# Patient Record
Sex: Female | Born: 1970 | Race: White | Hispanic: No | Marital: Married | Smoking: Never smoker
Health system: Southern US, Community
[De-identification: ages and names within clinical notes are randomized; demographics above are authoritative.]

## PROBLEM LIST (undated history)

## (undated) HISTORY — PX: TUBAL LIGATION: SHX77

---

## 2020-08-02 LAB — HM PAP SMEAR: HM Pap smear: NORMAL

## 2020-08-02 LAB — HM MAMMOGRAPHY: HM Mammogram: NORMAL (ref 0–4)

## 2020-08-02 LAB — HM COLONOSCOPY

## 2021-08-11 ENCOUNTER — Encounter: Payer: Self-pay | Admitting: Medical-Surgical

## 2021-08-11 ENCOUNTER — Other Ambulatory Visit: Payer: Self-pay

## 2021-08-11 ENCOUNTER — Ambulatory Visit: Payer: 59 | Admitting: Medical-Surgical

## 2021-08-11 VITALS — BP 153/83 | HR 62 | Resp 20 | Ht 67.0 in | Wt 198.0 lb

## 2021-08-11 DIAGNOSIS — R0609 Other forms of dyspnea: Secondary | ICD-10-CM

## 2021-08-11 DIAGNOSIS — E611 Iron deficiency: Secondary | ICD-10-CM

## 2021-08-11 DIAGNOSIS — A6000 Herpesviral infection of urogenital system, unspecified: Secondary | ICD-10-CM

## 2021-08-11 DIAGNOSIS — Z7689 Persons encountering health services in other specified circumstances: Secondary | ICD-10-CM | POA: Diagnosis not present

## 2021-08-11 DIAGNOSIS — R079 Chest pain, unspecified: Secondary | ICD-10-CM

## 2021-08-11 DIAGNOSIS — I10 Essential (primary) hypertension: Secondary | ICD-10-CM | POA: Diagnosis not present

## 2021-08-11 LAB — COMPLETE METABOLIC PANEL WITH GFR
AG Ratio: 1.6 (calc) (ref 1.0–2.5)
ALT: 15 U/L (ref 6–29)
AST: 16 U/L (ref 10–35)
Albumin: 4.7 g/dL (ref 3.6–5.1)
Alkaline phosphatase (APISO): 71 U/L (ref 31–125)
BUN: 15 mg/dL (ref 7–25)
CO2: 25 mmol/L (ref 20–32)
Calcium: 9.5 mg/dL (ref 8.6–10.2)
Chloride: 103 mmol/L (ref 98–110)
Creat: 0.74 mg/dL (ref 0.50–0.99)
Globulin: 3 g/dL (calc) (ref 1.9–3.7)
Glucose, Bld: 90 mg/dL (ref 65–99)
Potassium: 3.9 mmol/L (ref 3.5–5.3)
Sodium: 137 mmol/L (ref 135–146)
Total Bilirubin: 0.5 mg/dL (ref 0.2–1.2)
Total Protein: 7.7 g/dL (ref 6.1–8.1)
eGFR: 99 mL/min/{1.73_m2} (ref >=60)

## 2021-08-11 LAB — CBC WITH DIFFERENTIAL/PLATELET
Absolute Monocytes: 450 cells/uL (ref 200–950)
Basophils Absolute: 32 cells/uL (ref 0–200)
Basophils Relative: 0.4 %
Eosinophils Absolute: 63 cells/uL (ref 15–500)
Eosinophils Relative: 0.8 %
HCT: 36.3 % (ref 35.0–45.0)
Hemoglobin: 10.4 g/dL — ABNORMAL LOW (ref 11.7–15.5)
Lymphs Abs: 1651 cells/uL (ref 850–3900)
MCH: 21.1 pg — ABNORMAL LOW (ref 27.0–33.0)
MCHC: 28.7 g/dL — ABNORMAL LOW (ref 32.0–36.0)
MCV: 73.5 fL — ABNORMAL LOW (ref 80.0–100.0)
MPV: 10.5 fL (ref 7.5–12.5)
Monocytes Relative: 5.7 %
Neutro Abs: 5704 cells/uL (ref 1500–7800)
Neutrophils Relative %: 72.2 %
Platelets: 336 10*3/uL (ref 140–400)
RBC: 4.94 10*6/uL (ref 3.80–5.10)
RDW: 15.4 % — ABNORMAL HIGH (ref 11.0–15.0)
Total Lymphocyte: 20.9 %
WBC: 7.9 10*3/uL (ref 3.8–10.8)

## 2021-08-11 LAB — LIPID PANEL
Cholesterol: 199 mg/dL (ref <200)
HDL: 66 mg/dL (ref >=50)
LDL Cholesterol (Calc): 113 mg/dL (calc) — ABNORMAL HIGH
Non-HDL Cholesterol (Calc): 133 mg/dL (calc) — ABNORMAL HIGH (ref <130)
Total CHOL/HDL Ratio: 3 (calc) (ref <5.0)
Triglycerides: 96 mg/dL (ref <150)

## 2021-08-11 LAB — TSH: TSH: 1.17 mIU/L

## 2021-08-11 MED ORDER — AMLODIPINE BESYLATE 5 MG PO TABS: 5.0000 mg | ORAL_TABLET | Freq: Every day | ORAL | 0 refills | Status: AC

## 2021-08-11 NOTE — Patient Instructions (Signed)

## 2021-08-11 NOTE — Progress Notes (Signed)
New Patient Office Visit  Subjective with pertinent ROS:  Patient ID: Kim Gillespie, female    DOB: 27-Oct-1970  Age: 51 y.o. MRN: [id number]  CC:  Chief Complaint  Patient presents with   Establish Care     HPI Kim Gillespie presents to establish care. She is a pleasant 51 year old female who has recently relocated to the area with her husband and children. This has been a bit stressful for her and she is trying to manage feelings of depression. Not currently taking medications. Not doing counseling. Is a person of strong faith and has trouble reconciling the need for a counselor with the teachings of her faith.   Has a history of elevated blood pressures and was previously treated with HCTZ (unknown dose) which was reportedly ineffective. Not currently treated with medication. Admits to adding salt to food. Has monitored her BP at home in the past but does not do so anymore because it was always elevated and her prior PCP told her to just stop checking it. Has been having chest pains/pressure along with shortness of breath with increased activity but also some episodes while at rest. Pain occasionally occurs after eating. Has noted some radiation into the shoulders/neck/jaw at times. This has been going on for more than a few months. Does not recall having an EKG, echocardiogram, etc. Last labs checked around May 2022.   Brief exam, Assessment, and Plan:  Today's Vitals: BP (!) 153/83 (BP Location: Left Arm, Patient Position: Sitting, Cuff Size: Normal)    Pulse 62    Resp 20    Wt 198 lb (89.8 kg)    LMP  (LMP Unknown)    SpO2 98% .  1. Encounter to establish care Reviewed available information and discussed care concerns with patient.   2. Essential hypertension Checking labs as below.  On exam, regular heart rate and rhythm, EKG done in office showing rate of 64, normal sinus rhythm with sinus arrhythmia, normal axis, questionable septal infarct but no acute changes today.  No significant  lower extremity edema or dyspnea at rest.  No current chest pain.  With her history of consistently high blood pressures, recommend low-sodium diet, regular intense exercise, as well as starting medication to help control blood pressure.  Discussed this with patient and she is amenable to trialing medication.  She did not do well with a hydrochlorothiazide dose in the past so we will start amlodipine 5 mg daily. - CBC with Differential - COMPLETE METABOLIC PANEL WITH GFR - Lipid panel - TSH  3. Chest pain, unspecified type 4. Dyspnea on exertion This appears to be a long-term issue and with no records on hand, I am unsure what has been done in the past.  EKG in office today as above.  Checking labs.  Will likely order an echocardiogram for further evaluation.  Ultimately, suspect we will need to get her in with cardiology for further work-up and management. - EKG 12-Lead - CBC with Differential - COMPLETE METABOLIC PANEL WITH GFR - Lipid panel - TSH  5. Genital herpes simplex, unspecified site Continue acyclovir as needed for outbreaks.   Outpatient Encounter Medications as of 08/11/2021  Medication Sig   Acyclovir (ZOVIRAX PO) Take by mouth as needed.   amLODipine (NORVASC) 5 MG tablet Take 1 tablet (5 mg total) by mouth daily.   No facility-administered encounter medications on file as of 08/11/2021.    Follow-up: Return in about 2 weeks (around 08/25/2021) for nurse visit for BP check.  Thayer Ohm, DNP, APRN, FNP-BC [facility] and Sports Medicine

## 2021-08-12 ENCOUNTER — Other Ambulatory Visit: Payer: Self-pay | Admitting: Medical-Surgical

## 2021-08-12 DIAGNOSIS — R079 Chest pain, unspecified: Secondary | ICD-10-CM

## 2021-08-12 DIAGNOSIS — R0609 Other forms of dyspnea: Secondary | ICD-10-CM

## 2021-08-12 DIAGNOSIS — I10 Essential (primary) hypertension: Secondary | ICD-10-CM

## 2021-08-12 NOTE — Addendum Note (Signed)
Addended by: Stan Head on: 08/12/2021 02:03 PM   Modules accepted: Orders

## 2021-08-14 ENCOUNTER — Telehealth: Payer: Self-pay | Admitting: *Deleted

## 2021-08-14 NOTE — Telephone Encounter (Signed)
Per referral Dr. Referral called and gave upcoming appointments - confirmed

## 2021-08-19 ENCOUNTER — Encounter: Payer: Self-pay | Admitting: Obstetrics & Gynecology

## 2021-08-27 DIAGNOSIS — R072 Precordial pain: Secondary | ICD-10-CM | POA: Insufficient documentation

## 2021-08-27 NOTE — Progress Notes (Signed)
Cardiology Office Note   Date:  08/28/2021   ID:  Kim Gillespie, DOB 1970/10/10, MRN [id number]  PCP:  Kim Butter, NP  Cardiologist:   None Referring:  Kim Butter, NP  Chief Complaint  Patient presents with   Chest Pain      History of Present Illness: Kim Gillespie is a 51 y.o. female who presents for evaluation of chest pain.  She was referred by Kim Gillespie, NPPatient has no past cardiac history.  She says about a year and a half she has been getting intermittent mid upper left chest discomfort.  It is sharp.  It comes and goes.  It happens when she is not doing anything.  It might happen after eating.  It might be more reproducible with walking but not all the time.  She thinks it does happen most of the time when she is walking and she sometimes "powers through it."  He does not go away until she really stops what she is doing.  She will notice it goes away after about 5 minutes.  It might be 6 out of 10 in intensity at that most.  It seems to be somewhat stable intermittent pattern.  She has not had any prior cardiac testing.  She does not describe radiation to her arms.  She has had some jaw popping and discomfort in the past not related to the chest discomfort.  She has not had any associated shortness of breath and does not describe PND or orthopnea.  She is not having any new palpitations, presyncope or syncope.     History reviewed. No pertinent past medical history.  Past Surgical History:  Procedure Laterality Date   TUBAL LIGATION       Current Outpatient Medications  Medication Sig Dispense Refill   Acyclovir (ZOVIRAX PO) Take by mouth as needed.     amLODipine (NORVASC) 5 MG tablet Take 1 tablet (5 mg total) by mouth daily. (Patient not taking: Reported on 08/28/2021) 90 tablet 0   No current facility-administered medications for this visit.    Allergies:   Iodine    Social History:  The patient  reports that she has never smoked. She has never used  smokeless tobacco. She reports that she does not drink alcohol and does not use drugs.   Family History:  The patient's family history includes Diabetes in her father; Hypertension in her brother, father, and mother.    ROS:  Please see the history of present illness.   Otherwise, review of systems are positive for none.   All other systems are reviewed and negative.    PHYSICAL EXAM: VS:  BP (!) 142/98    Pulse 78    Ht 5' 5.5" (1.664 m)    Wt 202 lb 9.6 oz (91.9 kg)    LMP  (LMP Unknown)    SpO2 100%    BMI 33.20 kg/m  , BMI Body mass index is 33.2 kg/m. GENERAL:  Well appearing HEENT:  Pupils equal round and reactive, fundi not visualized, oral mucosa unremarkable NECK:  No jugular venous distention, waveform within normal limits, carotid upstroke brisk and symmetric, no bruits, no thyromegaly LYMPHATICS:  No cervical, inguinal adenopathy LUNGS:  Clear to auscultation bilaterally BACK:  No CVA tenderness CHEST:  Unremarkable HEART:  PMI not displaced or sustained,S1 and S2 within normal limits, no S3, no S4, no clicks, no rubs, no murmurs ABD:  Flat, positive bowel sounds normal in frequency in pitch, no bruits, no  rebound, no guarding, no midline pulsatile mass, no hepatomegaly, no splenomegaly EXT:  2 plus pulses throughout, no edema, no cyanosis no clubbing SKIN:  No rashes no nodules NEURO:  Cranial nerves II through XII grossly intact, motor grossly intact throughout PSYCH:  Cognitively intact, oriented to person place and time    EKG:  EKG is not ordered today. The ekg ordered 08/11/2021 demonstrates normal sinus rhythm, rate 64, axis within normal limits, intervals within normal limits, no acute ST-T wave changes.   Recent Labs: 08/11/2021: ALT 15; BUN 15; Creat 0.74; Hemoglobin 10.4; Platelets 336; Potassium 3.9; Sodium 137; TSH 1.17    Lipid Panel    Component Value Date/Time   CHOL 199 08/11/2021 0000   TRIG 96 08/11/2021 0000   HDL 66 08/11/2021 0000   CHOLHDL  3.0 08/11/2021 0000   LDLCALC 113 (H) 08/11/2021 0000      Wt Readings from Last 3 Encounters:  08/28/21 202 lb 9.6 oz (91.9 kg)  08/11/21 198 lb (89.8 kg)      Other studies Reviewed: Additional studies/ records that were reviewed today include: Labs. Review of the above records demonstrates:  Please see elsewhere in the note.     ASSESSMENT AND PLAN:  Precordial chest pain.  Her chest pain has nonanginal greater than anginal features.  I think the pretest probability of obstructive coronary disease is low.  She does have some risk factors with mild dyslipidemia and new hypertension diagnosis.  I will bring her back for a POET (Plain Old Exercise Treadmill) and a coronary calcium score.  HTN: She was just diagnosed with hypertension.  She has not yet started her amlodipine that was prescribed by her primary provider and I would agree with this choice.   Current medicines are reviewed at length with the patient today.  The patient does not have concerns regarding medicines.  The following changes have been made:  no change  Labs/ tests ordered today include:   Orders Placed This Encounter  Procedures   CT CARDIAC SCORING (SELF PAY ONLY)   Exercise Tolerance Test     Disposition:   FU with me as needed.      Signed, Rollene Rotunda, MD  08/28/2021 4:52 PM    [facility]

## 2021-08-28 ENCOUNTER — Ambulatory Visit: Payer: 59

## 2021-08-28 ENCOUNTER — Other Ambulatory Visit: Payer: Self-pay

## 2021-08-28 ENCOUNTER — Ambulatory Visit: Payer: 59 | Admitting: Cardiology

## 2021-08-28 ENCOUNTER — Encounter: Payer: Self-pay | Admitting: Cardiology

## 2021-08-28 VITALS — BP 142/98 | HR 78 | Ht 65.5 in | Wt 202.6 lb

## 2021-08-28 DIAGNOSIS — R072 Precordial pain: Secondary | ICD-10-CM | POA: Diagnosis not present

## 2021-08-28 NOTE — Patient Instructions (Addendum)
Medication Instructions:  Your physician recommends that you continue on your current medications as directed. Please refer to the Current Medication list given to you today.   Labwork: NONE  Testing/Procedures: CALCIUM SCORE - THIS WILL COST YOU $99 OUT OF POCKET  Your physician has requested that you have an exercise tolerance test. For further information please visit [email or web address] Please also follow instruction sheet, as given.  Follow-Up: AS NEEDED   OMRON IS THE BLOOD PRESSURE MACHINE RECOMMENDED NOT THE ONE THAT GOES AROUND THE WRIST

## 2021-09-02 ENCOUNTER — Other Ambulatory Visit: Payer: 59

## 2021-09-02 ENCOUNTER — Encounter: Payer: 59 | Admitting: Hematology & Oncology

## 2021-09-08 ENCOUNTER — Ambulatory Visit (HOSPITAL_COMMUNITY): Admission: RE | Admit: 2021-09-08 | Payer: 59 | Source: Ambulatory Visit | Attending: Cardiology | Admitting: Cardiology

## 2021-09-08 NOTE — Addendum Note (Signed)
Addended by: Tobin Chad on: 09/08/2021 09:20 AM   Modules accepted: Orders

## 2021-09-08 NOTE — Addendum Note (Signed)
Addended by: Rollene Rotunda on: 09/08/2021 05:46 PM   Modules accepted: Orders

## 2021-09-09 ENCOUNTER — Inpatient Hospital Stay: Payer: 59 | Admitting: Hematology & Oncology

## 2021-09-09 ENCOUNTER — Encounter: Payer: Self-pay | Admitting: Hematology & Oncology

## 2021-09-09 ENCOUNTER — Other Ambulatory Visit: Payer: Self-pay

## 2021-09-09 ENCOUNTER — Inpatient Hospital Stay: Payer: 59 | Attending: Hematology & Oncology

## 2021-09-09 VITALS — BP 129/87 | HR 71 | Temp 98.8°F | Resp 18 | Ht 65.5 in | Wt 202.8 lb

## 2021-09-09 DIAGNOSIS — N921 Excessive and frequent menstruation with irregular cycle: Secondary | ICD-10-CM | POA: Insufficient documentation

## 2021-09-09 DIAGNOSIS — D509 Iron deficiency anemia, unspecified: Secondary | ICD-10-CM

## 2021-09-09 DIAGNOSIS — Z7189 Other specified counseling: Secondary | ICD-10-CM | POA: Insufficient documentation

## 2021-09-09 DIAGNOSIS — D500 Iron deficiency anemia secondary to blood loss (chronic): Secondary | ICD-10-CM

## 2021-09-09 HISTORY — DX: Other specified counseling: Z71.89

## 2021-09-09 HISTORY — DX: Iron deficiency anemia, unspecified: D50.9

## 2021-09-09 HISTORY — DX: Excessive and frequent menstruation with irregular cycle: N92.1

## 2021-09-09 LAB — CMP (CANCER CENTER ONLY)
ALT: 15 U/L (ref 0–44)
AST: 14 U/L — ABNORMAL LOW (ref 15–41)
Albumin: 4 g/dL (ref 3.5–5.0)
Alkaline Phosphatase: 66 U/L (ref 38–126)
Anion gap: 6 (ref 5–15)
BUN: 12 mg/dL (ref 6–20)
CO2: 28 mmol/L (ref 22–32)
Calcium: 9.3 mg/dL (ref 8.9–10.3)
Chloride: 103 mmol/L (ref 98–111)
Creatinine: 0.73 mg/dL (ref 0.44–1.00)
GFR, Estimated: >60 mL/min (ref >60)
Glucose, Bld: 88 mg/dL (ref 70–99)
Potassium: 3.9 mmol/L (ref 3.5–5.1)
Sodium: 137 mmol/L (ref 135–145)
Total Bilirubin: 0.3 mg/dL (ref 0.3–1.2)
Total Protein: 6.8 g/dL (ref 6.5–8.1)

## 2021-09-09 LAB — CBC WITH DIFFERENTIAL (CANCER CENTER ONLY)
Abs Immature Granulocytes: 0.02 10*3/uL (ref 0.00–0.07)
Basophils Absolute: 0.1 10*3/uL (ref 0.0–0.1)
Basophils Relative: 1 %
Eosinophils Absolute: 0.1 10*3/uL (ref 0.0–0.5)
Eosinophils Relative: 1 %
HCT: 31.6 % — ABNORMAL LOW (ref 36.0–46.0)
Hemoglobin: 9.2 g/dL — ABNORMAL LOW (ref 12.0–15.0)
Immature Granulocytes: 0 %
Lymphocytes Relative: 25 %
Lymphs Abs: 1.8 10*3/uL (ref 0.7–4.0)
MCH: 21.1 pg — ABNORMAL LOW (ref 26.0–34.0)
MCHC: 29.1 g/dL — ABNORMAL LOW (ref 30.0–36.0)
MCV: 72.5 fL — ABNORMAL LOW (ref 80.0–100.0)
Monocytes Absolute: 0.5 10*3/uL (ref 0.1–1.0)
Monocytes Relative: 8 %
Neutro Abs: 4.7 10*3/uL (ref 1.7–7.7)
Neutrophils Relative %: 65 %
Platelet Count: 275 10*3/uL (ref 150–400)
RBC: 4.36 MIL/uL (ref 3.87–5.11)
RDW: 17.3 % — ABNORMAL HIGH (ref 11.5–15.5)
WBC Count: 7.1 10*3/uL (ref 4.0–10.5)
nRBC: 0 % (ref 0.0–0.2)

## 2021-09-09 LAB — RETICULOCYTES
Immature Retic Fract: 18.9 % — ABNORMAL HIGH (ref 2.3–15.9)
RBC.: 4.35 MIL/uL (ref 3.87–5.11)
Retic Count, Absolute: 52.2 10*3/uL (ref 19.0–186.0)
Retic Ct Pct: 1.2 % (ref 0.4–3.1)

## 2021-09-09 LAB — SAVE SMEAR(SSMR), FOR PROVIDER SLIDE REVIEW

## 2021-09-09 NOTE — Progress Notes (Signed)
Referral MD  Reason for Referral: Microcytic anemia-likely iron deficiency secondary to menometrorrhagia  Chief Complaint  Patient presents with   New Patient (Initial Visit)  : I feel tired and more blood is low.  HPI: Kim Gillespie is a very nice 51 year old Afro-American female.  She  I moved here from [place] back in October.  They were in [place] for several years.  They lived in the [place].  She has been feeling tired.  She does have heavy cycles.  She had lab work done about a month ago.  This showed a hemoglobin of 10.4 hematocrit of 36.3.  The MCV was only 73.5.  She has not had any other issues with bleeding outside of the monthly cycles.  She is not a vegetarian.  She does chew a lot of iron.  She has had past surgery only with tube tine.  There is no one in the family that has malignancy.  As far as he knows, there is no history of sickle cell.  She does not drink.  She does not smoke.  She just feels tired.  She does not have a lot of stamina.  Currently, I would say performance status is probably ECOG 1.   No past medical history on file.:   Past Surgical History:  Procedure Laterality Date   TUBAL LIGATION    :   Current Outpatient Medications:    Acyclovir (ZOVIRAX PO), Take by mouth as needed., Disp: , Rfl:    amLODipine (NORVASC) 5 MG tablet, Take 1 tablet (5 mg total) by mouth daily., Disp: 90 tablet, Rfl: 0:  :   Allergies  Allergen Reactions   Iodine Shortness Of Breath  :   Family History  Problem Relation Age of Onset   Hypertension Mother    Diabetes Father    Hypertension Father    Hypertension Brother   :   Social History   Socioeconomic History   Marital status: Married    Spouse name: Not on file   Number of children: Not on file   Years of education: Not on file   Highest education level: Not on file  Occupational History   Not on file  Tobacco Use   Smoking status: Never   Smokeless tobacco: Never   Vaping Use   Vaping Use: Never used  Substance and Sexual Activity   Alcohol use: Never   Drug use: Never   Sexual activity: Yes    Birth control/protection: Surgical  Other Topics Concern   Not on file  Social History Narrative   Lives at home with husband and children.  Three children.       Social Determinants of Health   Financial Resource Strain: Not on file  Food Insecurity: Not on file  Transportation Needs: Not on file  Physical Activity: Not on file  Stress: Not on file  Social Connections: Not on file  Intimate Partner Violence: Not on file  :  Review of Systems  Constitutional:  Positive for malaise/fatigue.  HENT: Negative.    Eyes: Negative.   Respiratory: Negative.    Cardiovascular: Negative.   Gastrointestinal: Negative.   Genitourinary:  Positive for frequency.  Musculoskeletal: Negative.   Skin: Negative.   Neurological: Negative.   Endo/Heme/Allergies: Negative.   Psychiatric/Behavioral: Negative.      Exam: @IPVITALS @ Physical Exam Vitals reviewed.  HENT:     Head: Normocephalic and atraumatic.  Eyes:     Pupils: Pupils are equal, round, and reactive to light.  Cardiovascular:     Rate and Rhythm: Normal rate and regular rhythm.     Heart sounds: Normal heart sounds.  Pulmonary:     Effort: Pulmonary effort is normal.     Breath sounds: Normal breath sounds.  Abdominal:     General: Bowel sounds are normal.     Palpations: Abdomen is soft.  Musculoskeletal:        General: No tenderness or deformity. Normal range of motion.     Cervical back: Normal range of motion.  Lymphadenopathy:     Cervical: No cervical adenopathy.  Skin:    General: Skin is warm and dry.     Findings: No erythema or rash.  Neurological:     Mental Status: She is alert and oriented to person, place, and time.  Psychiatric:        Behavior: Behavior normal.        Thought Content: Thought content normal.        Judgment: Judgment normal.      Recent  Labs    09/09/21 1340  WBC 7.1  HGB 9.2*  HCT 31.6*  PLT 275    Recent Labs    09/09/21 1340  NA 137  K 3.9  CL 103  CO2 28  GLUCOSE 88  BUN 12  CREATININE 0.73  CALCIUM 9.3    Blood smear review: She has mild anisocytosis and poikilocytosis.  There is some polychromasia.  I do not see any nucleated red blood cells.  I do not see any target cells.  She has no schistocytes.  There is no rouleaux formation.  White blood cells appear normal in morphology maturation.  There are no hypersegmented polys.  There is no immature myeloid or lymphoid cells.  Platelets are adequate in number and size.  Platelets are well granulated.  Pathology: None    Assessment and Plan: Kim Gillespie is a very nice 51 year old Afro-American female.  She has microcytic anemia.  I had to believe this could be iron deficiency by the blood smear and by her history.  She does have the menometrorrhagia.  We will see what her iron studies show.  I did talk to her about the options that we have.  We can certainly try oral iron.  She is young.  She is in great condition.  She certainly would be a candidate for oral iron.  I told her that 1 issue with oral iron is at there are possibilities of abdominal spasms.  I also talked her about IV iron.  This also would be a option for her.  We will see what her iron studies show.  We will then give her a call and then we will decide what might be best for her.  We typically see patients back about 6 weeks after IV iron or oral iron is given.  Hopefully, with Kim Gillespie, when we see her back, her MCV will be better and her hemoglobin will definitely improve.  As she will have a better quality of life with more stamina.

## 2021-09-10 ENCOUNTER — Encounter: Payer: Self-pay | Admitting: *Deleted

## 2021-09-10 LAB — FERRITIN: Ferritin: <4 ng/mL — ABNORMAL LOW (ref 11–307)

## 2021-09-10 LAB — ERYTHROPOIETIN: Erythropoietin: 113 m[IU]/mL — ABNORMAL HIGH (ref 2.6–18.5)

## 2021-09-11 ENCOUNTER — Telehealth: Payer: Self-pay | Admitting: Hematology & Oncology

## 2021-09-11 ENCOUNTER — Encounter: Payer: Self-pay | Admitting: Medical-Surgical

## 2021-09-11 LAB — HGB FRACTIONATION CASCADE
Hgb A2: 1.9 % (ref 1.8–3.2)
Hgb A: 98.1 % (ref 96.4–98.8)
Hgb F: 0 % (ref 0.0–2.0)
Hgb S: 0 %

## 2021-09-11 NOTE — Telephone Encounter (Signed)
Called to schedule 3 doses of iron (Venofer 300 mg) per 2/10 sch msg , no answer and unable to leave voicemail

## 2021-09-16 ENCOUNTER — Other Ambulatory Visit: Payer: 59

## 2021-09-17 ENCOUNTER — Other Ambulatory Visit: Payer: Self-pay | Admitting: Hematology & Oncology

## 2021-09-17 ENCOUNTER — Other Ambulatory Visit: Payer: Self-pay

## 2021-09-17 ENCOUNTER — Inpatient Hospital Stay: Payer: 59

## 2021-09-17 VITALS — BP 145/78 | HR 75 | Temp 98.1°F | Resp 17

## 2021-09-17 DIAGNOSIS — D508 Other iron deficiency anemias: Secondary | ICD-10-CM

## 2021-09-17 DIAGNOSIS — D509 Iron deficiency anemia, unspecified: Secondary | ICD-10-CM | POA: Diagnosis not present

## 2021-09-17 MED ORDER — SODIUM CHLORIDE 0.9 % IV SOLN: Freq: Once | INTRAVENOUS | Status: AC

## 2021-09-17 MED ORDER — SODIUM CHLORIDE 0.9 % IV SOLN
300.0000 mg | Freq: Once | INTRAVENOUS | Status: AC
  Administered 2021-09-17: 300 mg via INTRAVENOUS
  Filled 2021-09-17: qty 300

## 2021-09-17 NOTE — Patient Instructions (Signed)

## 2021-09-24 ENCOUNTER — Encounter: Payer: Self-pay | Admitting: Medical-Surgical

## 2021-09-24 ENCOUNTER — Inpatient Hospital Stay: Payer: 59

## 2021-09-24 ENCOUNTER — Telehealth: Payer: 59 | Admitting: Medical-Surgical

## 2021-09-24 ENCOUNTER — Other Ambulatory Visit: Payer: Self-pay

## 2021-09-24 ENCOUNTER — Telehealth (HOSPITAL_COMMUNITY): Payer: Self-pay | Admitting: *Deleted

## 2021-09-24 VITALS — BP 150/92 | HR 74 | Temp 98.9°F | Resp 18

## 2021-09-24 DIAGNOSIS — F418 Other specified anxiety disorders: Secondary | ICD-10-CM | POA: Diagnosis not present

## 2021-09-24 DIAGNOSIS — D509 Iron deficiency anemia, unspecified: Secondary | ICD-10-CM | POA: Diagnosis not present

## 2021-09-24 DIAGNOSIS — D508 Other iron deficiency anemias: Secondary | ICD-10-CM

## 2021-09-24 MED ORDER — SODIUM CHLORIDE 0.9 % IV SOLN: Freq: Once | INTRAVENOUS | Status: AC

## 2021-09-24 MED ORDER — SODIUM CHLORIDE 0.9 % IV SOLN
300.0000 mg | Freq: Once | INTRAVENOUS | Status: AC
  Administered 2021-09-24: 300 mg via INTRAVENOUS
  Filled 2021-09-24: qty 300

## 2021-09-24 NOTE — Patient Instructions (Signed)

## 2021-09-24 NOTE — Telephone Encounter (Signed)
Close encounter 

## 2021-09-24 NOTE — Progress Notes (Signed)
Virtual Visit via Telephone   I connected with  Kim Gillespie  on 09/24/21 by telephone/telehealth and verified that I am speaking with the correct person using two identifiers.   I discussed the limitations, risks, security and privacy concerns of performing an evaluation and management service by telephone, including the higher likelihood of inaccurate diagnosis and treatment, and the availability of in person appointments.  We also discussed the likely need of an additional face to face encounter for complete and high quality delivery of care.  I also discussed with the patient that there may be a patient responsible charge related to this service. The patient expressed understanding and wishes to proceed.  Provider location is in medical facility. Patient location is at their home, different from provider location. People involved in care of the patient during this telehealth encounter were myself, my nurse/medical assistant, and my front office/scheduling team member.  CC: Acute anxiety  HPI: Pleasant 51 year old female presenting via telephone to discuss this with acute anxiety, depression, and passive thoughts of self-harm with no active plan.  She has been struggling with this for a while.  Is not currently interested in medication however is looking for counseling and possibly time away from work to help settle her mental health issues.  She is working with the [employer] at her job but they have recommended counseling on her own or an outpatient counseling program.  She had originally discussed a possible referral to behavioral health through her PCP however this would be quite costly with her insurance.  If she goes to the EAP, the cost is negligible.  She has not tried over-the-counter/herbal options for anxiety management but is open to suggestions.  Denies HI.  Review of Systems: See HPI for pertinent positives and negatives.   Objective Findings:    General: Speaking full sentences, no  audible heavy breathing.  Sounds alert and appropriately interactive.    Impression and Recommendations:    1. Anxiety with depression Plan to proceed with counseling through the EAP as this is financially feasible.  Referring to psychiatry for further evaluation and recommendations.  Denies active plan for self-harm at this time.  Sending psychological emergency resources via MyChart message.  Also including information on herbal options and over-the-counter medications that may help with her levels of anxiety.  Discussed medication for mood management but she is not open to this at this time. - Ambulatory referral to Psychiatry  I discussed the above assessment and treatment plan with the patient. The patient was provided an opportunity to ask questions and all were answered. The patient agreed with the plan and demonstrated an understanding of the instructions.   The patient was advised to call back or seek an in-person evaluation if the symptoms worsen or if the condition fails to improve as anticipated.  15 minutes of non-face-to-face time was provided during this encounter.  Return if symptoms worsen or fail to improve. ___________________________________________ Christen Butter, DNP, APRN, FNP-BC Primary Care and Sports Medicine [facility] [place]

## 2021-09-25 ENCOUNTER — Ambulatory Visit (INDEPENDENT_AMBULATORY_CARE_PROVIDER_SITE_OTHER)
Admission: RE | Admit: 2021-09-25 | Discharge: 2021-09-25 | Disposition: A | Discharge status: Home / Self Care | Payer: Self-pay | Source: Ambulatory Visit | Attending: Cardiology | Admitting: Cardiology

## 2021-09-25 ENCOUNTER — Ambulatory Visit (HOSPITAL_COMMUNITY)
Admission: RE | Admit: 2021-09-25 | Discharge: 2021-09-25 | Disposition: A | Discharge status: Home / Self Care | Payer: 59 | Source: Ambulatory Visit | Attending: Cardiology | Admitting: Cardiology

## 2021-09-25 ENCOUNTER — Encounter: Payer: Self-pay | Admitting: Radiology

## 2021-09-25 DIAGNOSIS — R072 Precordial pain: Secondary | ICD-10-CM

## 2021-09-28 LAB — EXERCISE TOLERANCE TEST
Base ST Depression (mm): 0 mm
Estimated workload: 10.1
Exercise duration (min): 8 min
Exercise duration (sec): 17 s
MPHR: 171 {beats}/min
Peak HR: 169 {beats}/min
Percent HR: 98 %
Rest HR: 83 {beats}/min
ST Depression (mm): 0 mm

## 2021-10-01 ENCOUNTER — Encounter: Payer: Self-pay | Admitting: Hematology & Oncology

## 2021-10-01 ENCOUNTER — Inpatient Hospital Stay: Payer: 59 | Attending: Hematology & Oncology

## 2021-10-01 ENCOUNTER — Other Ambulatory Visit: Payer: Self-pay

## 2021-10-01 VITALS — BP 126/67 | HR 69 | Temp 98.5°F | Resp 17

## 2021-10-01 DIAGNOSIS — D500 Iron deficiency anemia secondary to blood loss (chronic): Secondary | ICD-10-CM | POA: Diagnosis not present

## 2021-10-01 DIAGNOSIS — N921 Excessive and frequent menstruation with irregular cycle: Secondary | ICD-10-CM | POA: Insufficient documentation

## 2021-10-01 DIAGNOSIS — Z79899 Other long term (current) drug therapy: Secondary | ICD-10-CM | POA: Insufficient documentation

## 2021-10-01 DIAGNOSIS — D508 Other iron deficiency anemias: Secondary | ICD-10-CM

## 2021-10-01 MED ORDER — SODIUM CHLORIDE 0.9 % IV SOLN
300.0000 mg | Freq: Once | INTRAVENOUS | Status: AC
  Administered 2021-10-01: 300 mg via INTRAVENOUS
  Filled 2021-10-01: qty 300

## 2021-10-01 MED ORDER — SODIUM CHLORIDE 0.9 % IV SOLN: Freq: Once | INTRAVENOUS | Status: AC

## 2021-10-01 NOTE — Progress Notes (Signed)
Pt declined to stay for post infusion observation period. Pt stated she has tolerated medication multiple times prior without difficulty. Pt aware to call clinic with any questions or concerns. Pt verbalized understanding and had no further questions.  ? ?

## 2021-10-01 NOTE — Patient Instructions (Signed)

## 2021-10-19 ENCOUNTER — Encounter: Payer: Self-pay | Admitting: Hematology & Oncology

## 2021-10-20 ENCOUNTER — Encounter: Payer: Self-pay | Admitting: Hematology & Oncology

## 2021-10-26 ENCOUNTER — Ambulatory Visit: Payer: Managed Care, Other (non HMO) | Admitting: Psychiatry

## 2021-10-28 ENCOUNTER — Other Ambulatory Visit: Payer: Self-pay

## 2021-10-28 ENCOUNTER — Other Ambulatory Visit: Payer: Self-pay | Admitting: *Deleted

## 2021-10-28 ENCOUNTER — Ambulatory Visit (INDEPENDENT_AMBULATORY_CARE_PROVIDER_SITE_OTHER): Payer: 59 | Admitting: Psychiatry

## 2021-10-28 DIAGNOSIS — F321 Major depressive disorder, single episode, moderate: Secondary | ICD-10-CM | POA: Diagnosis not present

## 2021-10-28 DIAGNOSIS — D509 Iron deficiency anemia, unspecified: Secondary | ICD-10-CM

## 2021-10-28 DIAGNOSIS — D500 Iron deficiency anemia secondary to blood loss (chronic): Secondary | ICD-10-CM

## 2021-10-28 DIAGNOSIS — D508 Other iron deficiency anemias: Secondary | ICD-10-CM

## 2021-10-28 NOTE — Progress Notes (Signed)
[facility] Counsel[facility] Initial Adult Exam ? ?Name: Kim Gillespie ?Date: 10/28/2021 ?MRN: [id number] ?DOB: 22-Dec-1970 ?PCP: Samuel Bouche, NP ? ?Time spent: 58 minutes ? ?Guardian/Payee:  patient   ? ?Paperwork requested:  No  ? ?Reason for Visit /Presenting Problem:  anxiety, depression, feeling sad, tired, stressed, some occasional SI triggered by stress "but none today" , previous family therapy a yr ago and some marital therapy about 2 yrs ago; work is very stressful. Doesn't want medication. Doesn't talk to anyone about "all this" except her husband "a little bit" ? ?Mental Status Exam: ?  ? ?Appearance:   Neat     ?Behavior:  Appropriate, Sharing, and Motivated  ?Motor:  Normal  ?Speech/Language:   Clear and Coherent  ?Affect:  Depressed and anxiety  ?Mood:  anxious and depressed  ?Thought process:  goal directed  ?Thought content:    Some overthinking  ?Sensory/Perceptual disturbances:    WNL  ?Orientation:  oriented to person, place, time/date, situation, day of week, month of year, year, and stated date of October 28, 2021  ?Attention:  Good  ?Concentration:  Fair  ?Memory:  Reports some short term memory issues and feels her stress is related to her memory issues  ?Fund of knowledge:   Good  ?Insight:    Good and Fair  ?Judgment:   Good  ?Impulse Control:  Good  ? ?Reported Symptoms:  see symptoms above ? ?Risk Assessment: ?Danger to Self:   not currently but does admit to occasional SI; commits to no self-harm today ?Self-injurious Behavior: No ?Danger to Others: No ?Duty to Warn:no ?Physical Aggression / Violence:No  ?Access to Firearms a concern: No  ?Gang Involvement:No  ?Patient / guardian was educated about steps to take if suicide or homicide risk level increases between visits: Denies any current SI. ?While future psychiatric events cannot be accurately predicted, the patient does not currently require acute inpatient psychiatric care and does not currently meet [facility] involuntary commitment  criteria. ? ?Substance Abuse History: ?Current substance abuse: No    ? ?Past Psychiatric History:   ?Previous psychological history is significant for marital and family therapy ?Outpatient Providers: in [place] ?History of Psych Hospitalization: No  ?Psychological Testing:  n/a   ? ?Abuse History: ?Victim of No.,  n/a    ?Report needed: No. ?Victim of Neglect:No. ?Perpetrator of  n/a   ?Witness / Exposure to Domestic Violence: No   ?Protective Services Involvement: No  ?Witness to [employer] Violence:  No  ? ?Family History: Reviewed with patient and she confirms information below. ?Family History  ?Problem Relation Age of Onset  ? Hypertension Mother   ? Diabetes Father   ? Hypertension Father   ? Hypertension Brother   ? ? ?Living situation: the patient lives with their family includes husband of 6 yrs, and 3 kids ages 89, 25, and 33, 2 girls and 1 boy. Husband does contract work but not currently working and is providing childcare for their younger child. 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 Work is very stressful in staying on top of her job. ? ?Sexual Orientation:  Straight ? ?Relationship Status: married  ?Name of spouse / other:n/a ?            If a parent, number of children / ages:see above ? ?Support Systems; spouse ?friends ?Mother is supportive, "I don't talk to my dad as I had to set some boundaries to protect myself." ? ?Financial Stress:  No  ? ?Income/Employment/Disability: Employment ? ?  Military Service: No  ? ?Educational History: ?Education: college graduate ? ?Religion/Sprituality/World View:    non-denominational Christian ? ?Any cultural differences that may affect / interfere with treatment:  not applicable  ? ?Recreation/Hobbies: being outside , being with family ? ?Stressors:Health problems   ?Occupational concerns   ? ?Strengths:  Supportive Relationships, Family, Friends, Spirituality, and Able to Communicate Effectively ? ?Barriers:  "I dont have time nor  space to think and work on me."  ? ?Legal History: ?Pending legal issue / charges: The patient has no significant history of legal issues. ?History of legal issue / charges:  n/a ? ?Medical History/Surgical History:Reviewed with patient and she confirms info below: ?Past Medical History:  ?Diagnosis Date  ? Goals of care, counseling/discussion 09/09/2021  ? Iron deficiency anemia 09/09/2021  ? Menometrorrhagia 09/09/2021  ? ? ?Past Surgical History:  ?Procedure Laterality Date  ? TUBAL LIGATION    ? ? ?Medications: ?Current Outpatient Medications  ?Medication Sig Dispense Refill  ? Acyclovir (ZOVIRAX PO) Take by mouth as needed.    ? amLODipine (NORVASC) 5 MG tablet Take 1 tablet (5 mg total) by mouth daily. 90 tablet 0  ? ?No current facility-administered medications for this visit.  ? ? ?Allergies  ?Allergen Reactions  ? Iodine Shortness Of Breath  ? ? ?Diagnoses:  ?  ICD-10-CM   ?1. Depression, major, single episode, moderate ([place])  F32.1   ?  ? ? ?Treatment goal plan: ?Patient not signing treatment plan on computer screen due to [place]. ?Treatment goals: ?Treatment goals remain on treatment plan as patient works with strategies to achieve her goals.  Progress is assessed each session and is documented in the "subject" and/or the "note" sections of treatment note. ?Long-term goal: ?Elevate mood and show evidence of usual energy, activities, and socialization level.  Alleviation of SI. ?Short-term goal: ?Verbalize an understanding of the relationship between repressed anger and depressed mood, and work with strategies to help patient express her anger in order to experience some alleviation of depressed mood. ?Strategies: ?Replace negative self-defeating self talk with verbalization of realistic and positive cognitive messages. ? ? ?Plan of Care: This is patient's first appointment with this therapist and we worked on her initial evaluation for therapy and her initial treatment goal plan.  She is a 51 year old female who  presents with anxiety, depression, feeling sad, tired, stressed, and some occasional SI triggered by stress "but none today".  Reports that she would definitely let provider know if she ever had SI and felt she was losing control that she would contact provider rather than acting out on those thoughts.  Shares that these "have been going on a while " but had worsened more recently.  Previous family therapy approximately a year ago and some marital therapy about 2 years ago.  States that work is very stressful.  Does not really want medication.  States that she does not talk to anyone about "all of this" except her husband "a little bit".  She lives in the family home with her husband of 23 years and they have 3 children ages 63, 59, and 45, which includes 2 girls and 1 boy.  Husband does contract work but not currently working because he provides supervision and care for the 50 year old who still lives in the home.  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  Although stressful, she feels she is staying on top of her job.  She denies any current substance abuse.  She states that  her mother is supportive but "I do not really talk to my dad".  Reports enjoying being outside and being with her family.  Does feel stressed about her job but seems to be managing it at this point.  Reports her strengths as being family, friends, a sense of spirituality, and supportive relationships.  As far as barriers, she states "I do not have time to think and work on me."  As far as mental status exam, patient's appearance is neat, behavior is appropriate and seems motivated, motor skills normal speech and language clear and coherent, affect anxiety and depression and denies any current SI, thought process is goal directed and thought content includes some overthinking, sensory/perceptual is WNL, she is well oriented to person/place/time/date/situation/day of week/month of year/year and stated date of October 28, 2021.  Her attention is good, concentration she reports being fair, reports some short-term memory issues "mostly when stressed", insight is good/fair, judgment good impulse control reported to be good and d

## 2021-10-29 ENCOUNTER — Inpatient Hospital Stay: Payer: 59 | Admitting: Hematology & Oncology

## 2021-10-29 ENCOUNTER — Other Ambulatory Visit: Payer: Self-pay

## 2021-10-29 ENCOUNTER — Inpatient Hospital Stay: Payer: 59

## 2021-10-29 ENCOUNTER — Encounter: Payer: Self-pay | Admitting: Hematology & Oncology

## 2021-10-29 VITALS — BP 150/112 | HR 56 | Temp 99.2°F | Resp 18 | Ht 65.5 in | Wt 203.8 lb

## 2021-10-29 DIAGNOSIS — D500 Iron deficiency anemia secondary to blood loss (chronic): Secondary | ICD-10-CM | POA: Diagnosis not present

## 2021-10-29 DIAGNOSIS — D509 Iron deficiency anemia, unspecified: Secondary | ICD-10-CM

## 2021-10-29 DIAGNOSIS — D508 Other iron deficiency anemias: Secondary | ICD-10-CM

## 2021-10-29 LAB — CBC WITH DIFFERENTIAL (CANCER CENTER ONLY)
Abs Immature Granulocytes: 0.02 10*3/uL (ref 0.00–0.07)
Basophils Absolute: 0 10*3/uL (ref 0.0–0.1)
Basophils Relative: 0 %
Eosinophils Absolute: 0.1 10*3/uL (ref 0.0–0.5)
Eosinophils Relative: 1 %
HCT: 41.5 % (ref 36.0–46.0)
Hemoglobin: 13 g/dL (ref 12.0–15.0)
Immature Granulocytes: 0 %
Lymphocytes Relative: 21 %
Lymphs Abs: 1.4 10*3/uL (ref 0.7–4.0)
MCH: 26.1 pg (ref 26.0–34.0)
MCHC: 31.3 g/dL (ref 30.0–36.0)
MCV: 83.2 fL (ref 80.0–100.0)
Monocytes Absolute: 0.3 10*3/uL (ref 0.1–1.0)
Monocytes Relative: 5 %
Neutro Abs: 5 10*3/uL (ref 1.7–7.7)
Neutrophils Relative %: 73 %
Platelet Count: 263 10*3/uL (ref 150–400)
RBC: 4.99 MIL/uL (ref 3.87–5.11)
RDW: 23.7 % — ABNORMAL HIGH (ref 11.5–15.5)
WBC Count: 6.9 10*3/uL (ref 4.0–10.5)
nRBC: 0 % (ref 0.0–0.2)

## 2021-10-29 LAB — CMP (CANCER CENTER ONLY)
ALT: 14 U/L (ref 0–44)
AST: 13 U/L — ABNORMAL LOW (ref 15–41)
Albumin: 4.5 g/dL (ref 3.5–5.0)
Alkaline Phosphatase: 68 U/L (ref 38–126)
Anion gap: 8 (ref 5–15)
BUN: 14 mg/dL (ref 6–20)
CO2: 24 mmol/L (ref 22–32)
Calcium: 9.4 mg/dL (ref 8.9–10.3)
Chloride: 105 mmol/L (ref 98–111)
Creatinine: 0.83 mg/dL (ref 0.44–1.00)
GFR, Estimated: >60 mL/min (ref >60)
Glucose, Bld: 116 mg/dL — ABNORMAL HIGH (ref 70–99)
Potassium: 3.9 mmol/L (ref 3.5–5.1)
Sodium: 137 mmol/L (ref 135–145)
Total Bilirubin: 0.3 mg/dL (ref 0.3–1.2)
Total Protein: 7.2 g/dL (ref 6.5–8.1)

## 2021-10-29 LAB — IRON AND IRON BINDING CAPACITY (CC-WL,HP ONLY)
Iron: 51 ug/dL (ref 28–170)
Saturation Ratios: 11 % (ref 10.4–31.8)
TIBC: 466 ug/dL — ABNORMAL HIGH (ref 250–450)
UIBC: 415 ug/dL (ref 148–442)

## 2021-10-29 LAB — FERRITIN: Ferritin: 50 ng/mL (ref 11–307)

## 2021-10-29 NOTE — Progress Notes (Signed)
?Hematology and Oncology Follow Up Visit ? ?Kim Gillespie ?[id number] ?[birth year] 51 y.o. ?10/29/2021 ? ? ?Principle Diagnosis:  ?Iron deficiency anemia secondary to menometrorrhagia ? ?Current Therapy:   ?IV iron-Venofer given on 10/01/2021 ?    ?Interim History:  Kim Gillespie is back for follow-up.  This is Kim second office visit.  We first saw Kim back in early February.  At that time, she was profoundly anemic.  She was microcytic.  We did iron studies on Kim.  Kim ferritin was less than 4. ? ?We did do hemoglobin electrophoresis.  She had normal hemoglobin electrophoresis. ? ?Kim corrected reticulocyte count at the time was less than 1%. ? ?She got Venofer.  She got 3 doses of Venofer.  This has helped Kim tremendously. ? ?Forgot to mention that she did have an erythropoietin level of 113. ? ?She is still having some heavy cycles. ? ?She does feel better.  She is having no problems with cravings.  She is not chewing ice. ? ?She has had no problems with fever.  She has had no nausea or vomiting.  She has had no cough or shortness of breath.  There is been no rashes.  She has had no leg swelling. ? ?Overall, I would say performance status is ECOG 1. ? ?Medications:  ?Current Outpatient Medications:  ?  Acyclovir (ZOVIRAX PO), Take by mouth as needed., Disp: , Rfl:  ?  amLODipine (NORVASC) 5 MG tablet, Take 1 tablet (5 mg total) by mouth daily., Disp: 90 tablet, Rfl: 0 ? ?Allergies:  ?Allergies  ?Allergen Reactions  ? Iodine Shortness Of Breath  ? ? ?Past Medical History, Surgical history, Social history, and Family History were reviewed and updated. ? ?Review of Systems: ?Review of Systems  ?Constitutional: Negative.   ?HENT:  Negative.    ?Eyes: Negative.   ?Respiratory: Negative.    ?Cardiovascular: Negative.   ?Gastrointestinal: Negative.   ?Endocrine: Negative.   ?Genitourinary: Negative.    ?Musculoskeletal: Negative.   ?Skin: Negative.   ?Neurological: Negative.   ?Hematological: Negative.    ?Psychiatric/Behavioral: Negative.    ? ?Physical Exam: ? height is 5' 5.5" (1.664 m) and weight is 203 lb 12.8 oz (92.4 kg). Kim oral temperature is 99.2 ?F (37.3 ?C). Kim blood pressure is 150/112 (abnormal) and Kim pulse is 56 (abnormal). Kim respiration is 18 and oxygen saturation is 100%.  ? ?Wt Readings from Last 3 Encounters:  ?10/29/21 203 lb 12.8 oz (92.4 kg)  ?09/09/21 202 lb 12.8 oz (92 kg)  ?08/28/21 202 lb 9.6 oz (91.9 kg)  ? ? ?Physical Exam ?Vitals reviewed.  ?HENT:  ?   Head: Normocephalic and atraumatic.  ?Eyes:  ?   Pupils: Pupils are equal, round, and reactive to light.  ?Cardiovascular:  ?   Rate and Rhythm: Normal rate and regular rhythm.  ?   Heart sounds: Normal heart sounds.  ?Pulmonary:  ?   Effort: Pulmonary effort is normal.  ?   Breath sounds: Normal breath sounds.  ?Abdominal:  ?   General: Bowel sounds are normal.  ?   Palpations: Abdomen is soft.  ?Musculoskeletal:     ?   General: No tenderness or deformity. Normal range of motion.  ?   Cervical back: Normal range of motion.  ?Lymphadenopathy:  ?   Cervical: No cervical adenopathy.  ?Skin: ?   General: Skin is warm and dry.  ?   Findings: No erythema or rash.  ?Neurological:  ?   Mental Status: She is  alert and oriented to person, place, and time.  ?Psychiatric:     ?   Behavior: Behavior normal.     ?   Thought Content: Thought content normal.     ?   Judgment: Judgment normal.  ? ? ? ?Lab Results  ?Component Value Date  ? WBC 6.9 10/29/2021  ? HGB 13.0 10/29/2021  ? HCT 41.5 10/29/2021  ? MCV 83.2 10/29/2021  ? PLT 263 10/29/2021  ? ?  Chemistry   ?   ?Component Value Date/Time  ? NA 137 10/29/2021 0934  ? K 3.9 10/29/2021 0934  ? CL 105 10/29/2021 0934  ? CO2 24 10/29/2021 0934  ? BUN 14 10/29/2021 0934  ? CREATININE 0.83 10/29/2021 0934  ? CREATININE 0.74 08/11/2021 0000  ?    ?Component Value Date/Time  ? CALCIUM 9.4 10/29/2021 0934  ? ALKPHOS 68 10/29/2021 0934  ? AST 13 (L) 10/29/2021 0934  ? ALT 14 10/29/2021 0934  ? BILITOT  0.3 10/29/2021 0934  ?  ? ? ? ?Impression and Plan: ?[name] is a very nice 51 year old Afro-American female.  She is profoundly iron deficient.  We have corrected this.  Kim hemoglobin is back to normal.  Kim MCV is better. ? ?We will have to see what Kim ferritin level and iron studies look like.  I suppose that she might need another dose to try to get Kim MCV a little bit higher. ? ?For right now, I think we probably get Kim back before Memorial Day.  I would want to see Kim back before the summer.  I want to make sure that Kim blood count is okay for the Summer so she can enjoy the Summer and have vacation. ? ? ?Kim Macho, MD ?3/30/202311:08 AM  ?

## 2021-10-30 ENCOUNTER — Telehealth: Payer: Self-pay | Admitting: Psychiatry

## 2021-10-30 NOTE — Telephone Encounter (Signed)
[employer] disability forms were received and given to [place]. ?

## 2021-11-02 ENCOUNTER — Encounter: Payer: Self-pay | Admitting: Medical-Surgical

## 2021-11-05 ENCOUNTER — Ambulatory Visit: Payer: 59 | Admitting: Psychiatry

## 2021-11-09 ENCOUNTER — Ambulatory Visit (INDEPENDENT_AMBULATORY_CARE_PROVIDER_SITE_OTHER): Payer: 59 | Admitting: Psychiatry

## 2021-11-09 DIAGNOSIS — F321 Major depressive disorder, single episode, moderate: Secondary | ICD-10-CM | POA: Diagnosis not present

## 2021-11-09 NOTE — Progress Notes (Signed)
?    [facility] Counselor/Therapist Progress Note ? ?Patient ID: Kim Gillespie, MRN: [id number],   ? ?Date: 11/09/2021 ? ?Time Spent: 55 minutes  ? ?Treatment Type: Individual Therapy ? ?Reported Symptoms: depression ? ?Mental Status Exam: ? ?Appearance:   Casual     ?Behavior:  Appropriate and Sharing  ?Motor:  Normal  ?Speech/Language:   Clear and Coherent  ?Affect:  Depressed  ?Mood:  depressed  ?Thought process:  goal directed  ?Thought content:    overthnking  ?Sensory/Perceptual disturbances:    WNL  ?Orientation:  oriented to person, place, time/date, situation, day of week, month of year, year, and stated date of November 09, 2021  ?Attention:  Good  ?Concentration:  Fair  ?Memory:  WNL  ?Fund of knowledge:   Good  ?Insight:    Good  ?Judgment:   Good  ?Impulse Control:  Good  ? ?Risk Assessment: ?Danger to Self:  No ?Self-injurious Behavior: No ?Danger to Others: No ?Duty to Warn:no ?Physical Aggression / Violence:No  ?Access to Firearms a concern: No  ?Gang Involvement:No  ? ?Subjective: Patient today reporting "mostly depression". Some occasional SI but denies any plan nor intent and would call if she felt she was losing control and may harm herself.  Husband supportive but not very helpful. Other people aren't that helpful as "they just try to be supportive."  Has moments of feeling better but isn't sustained. States I don't know what caused this, I feel like my goals may be changing, may need a reset, not sure what I want to do work-wise. Thoughts of maybe returning to teaching, or trying something different. "Not just work but also family/kids also a stressor." My kids aren't truthful with me and not talking straight with me. Daughter may be struggling with "gender identity" but not honest with me. Lots of questionmarks re: the kids and concerns with them. (Not all details included in this note per patient privacy needs.) Focused on some specific self-care strategies, and ways of looking at herself that  can help her mood. Working on "my sense of self as it is just off".  ?Review of goal-directed behaviors.  Does not want medications at this point. ? ?Interventions: Solution-Oriented/Positive Psychology and Insight-Oriented ?  ?Treatment goal plan: ?Patient not signing treatment plan on computer screen due to COVID. ?Treatment goals: ?Treatment goals remain on treatment plan as patient works with strategies to achieve her goals.  Progress is assessed each session and is documented in the "subject" and/or the "note" sections of treatment note. ?Long-term goal: ?Elevate mood and show evidence of usual energy, activities, and socialization level.  Alleviation of SI. ?Short-term goal: ?Verbalize an understanding of the relationship between repressed anger and depressed mood, and work with strategies to help patient express her anger in order to experience some alleviation of depressed mood. ?Strategies: ?Replace negative self-defeating self talk with verbalization of realistic and positive cognitive messages. ?  ?Diagnosis: ?  ICD-10-CM   ?1. Depression, major, single episode, moderate ([facility])  F32.1   ?  ? ? ?Plan:   Patient showing motivation and engaged in session today working more on her depression and self-care strategies. Mood a bit better today. Some occasional SI but states that she would not harm herself and if they ever felt they were getting out of control, she would go to the hospital for help or contact our office.  States that husband is supportive although not sure he fully understands.  Stress of her high school age kid and  2 in college are a factor.  Encouraged patient in practicing some strategies we discussed today related to her treatment goals.  Encouraged her also in the practice of more positive behaviors including: Staying in the present focusing on what she can control or change, get outside daily and walk, staying in touch with people who are supportive, look for more positives versus negatives  each day, healthy nutrition and exercise, stop assuming worst case scenarios, practice consistent positive self talk, stop self negating, reduce overthinking and over analyzing, interrupt negative/anxious thoughts challenging them to replace with more reality-based thoughts, reflect on her progress daily, recognize the strength she shows working with goal-directed behaviors to move in a direction that supports her improved emotional health and overall wellbeing. ? ?Goal review and progress/challenges noted with patient. ? ?Next appt within 2-3 weeks. ? ?This record has been created using [employer].  Chart creation errors have been sought, but may not always have been located and corrected.  Such creation errors do not reflect on the standard of medical care provided. ? ? ?Mathis Fare, LCSW ? ? ? ? ? ? ? ? ? ? ? ? ? ? ? ? ? ? ?

## 2021-11-11 ENCOUNTER — Telehealth: Payer: Self-pay

## 2021-11-11 NOTE — Telephone Encounter (Signed)
I have faxed the forms to [phone] ? ?Fax confirmation successful ? ?I have messaged the patient and made her aware of the form fee. ? ?

## 2021-11-11 NOTE — Telephone Encounter (Signed)
She would like for you to write her out off work for the dates of April 13 - April 26. ? ?I placed the forms back in your basket. ? ?

## 2021-11-16 ENCOUNTER — Ambulatory Visit (HOSPITAL_COMMUNITY)
Admission: EM | Admit: 2021-11-16 | Discharge: 2021-11-16 | Disposition: A | Discharge status: Home / Self Care | Payer: Self-pay

## 2021-11-16 NOTE — ED Notes (Signed)
Patient left AMA after receiving resources. ?

## 2021-11-16 NOTE — [facility] Counselor (Signed)
Kim Gillespie 51 year old female present to [facility] requesting to have a short-term disability form completed. She left AMA once she was provided community resources.  ?

## 2021-12-01 ENCOUNTER — Ambulatory Visit: Payer: 59 | Admitting: Psychiatry

## 2021-12-01 DIAGNOSIS — F321 Major depressive disorder, single episode, moderate: Secondary | ICD-10-CM

## 2021-12-01 NOTE — Progress Notes (Signed)
?    [facility] Counselor/Therapist Progress Note ? ?Patient ID: Kim Gillespie, MRN: [id number],   ? ?Date: 12/01/2021 ? ?Time Spent: 56 minutes  ? ?Treatment Type: Individual Therapy ? ?Reported Symptoms: anxiety, depression  ? ?Mental Status Exam: ? ?Appearance:   Casual     ?Behavior:  Appropriate, Sharing, and Motivated  ?Motor:  Normal  ?Speech/Language:   Clear and Coherent  ?Affect:  Depressed and anxious  ?Mood:  anxious and depressed  ?Thought process:  goal directed  ?Thought content:    WNL  ?Sensory/Perceptual disturbances:    WNL  ?Orientation:  oriented to person, place, time/date, situation, day of week, month of year, year, and stated date of Dec 01, 2021  ?Attention:  Good  ?Concentration:  Good and Fair  ?Memory:  WNL  ?Fund of knowledge:   Good  ?Insight:    Good  ?Judgment:   Good  ?Impulse Control:  Good  ? ?Risk Assessment: ?Danger to Self:  No ?Self-injurious Behavior: No ?Danger to Others: No ?Duty to Warn:no ?Physical Aggression / Violence:No  ?Access to Firearms a concern: No  ?Gang Involvement:No  ? ?Subjective:  Patient reports depression and anxiety with anxiety being some stronger today.  No SI currently.  Struggling with how to talk with her kids about her situation as I know they know "something is going on with me." Processed what she wants to be able to communicate with her teenage children and we discussed how she might share with them as they have been asking "what's wrong with me". Also discussed strategies to better identify her needs and managing her anxiety including the use of deep breathing exercises, mindfulness, exercise, encouraging self-talk, and setting better limits and boundaries with others and with work.  Practiced deep breathing exercises in session which she initially found difficult but the more she practiced she became more comfortable with it and understanding the need to really slow her breathing down in order for that strategy to be effective.  Seemed to feel a  little more hopeful by the end of session and realizing getting better is a work in progress.  He has not really wanted medications previously but is considering them at this point and is scheduled to see a med provider on 12/04/21. ? ?Interventions: Solution-Oriented/Positive Psychology and Insight-Oriented ? ?Treatment goal plan: ?Patient not signing treatment plan on computer screen due to COVID. ?Treatment goals: ?Treatment goals remain on treatment plan as patient works with strategies to achieve her goals.  Progress is assessed each session and is documented in the "subject" and/or the "note" sections of treatment note. ?Long-term goal: ?Elevate mood and show evidence of usual energy, activities, and socialization level.  Alleviation of SI. ?Short-term goal: ?Verbalize an understanding of the relationship between repressed anger and depressed mood, and work with strategies to help patient express her anger in order to experience some alleviation of depressed mood. ?Strategies: ?Replace negative self-defeating self talk with verbalization of realistic and positive cognitive messages. ? ?Diagnosis: ?  ICD-10-CM   ?1. Depression, major, single episode, moderate ([facility])  F32.1   ?  ? ?Plan: Patient today showing some improved motivation and was actively involved in session.  Is starting to consider possible medication for her symptoms and is scheduled to see a med provider on 12/04/2021.  She did work well today as we focused on some more strategies regarding her anxiety and depression.  Specifically worked on slow deliberate deep breathing exercises, mindfulness, exercise, more encouraging self talk, and setting better  limits and boundaries with others and with work.  Is temporarily written out of work.  Not sure when she feels she will be able to return but is considering requesting a reduction of hours on a more permanent basis.  Did smile more today and seemed to be more committed in treatment. Encouraged patient in  trying to practice more goal-directed positive behaviors including: Remaining in the present focusing on what she can control or change, getting outside daily and walking, staying in touch with people who are supportive, following through on some strategies discussed today including deep breathing exercises/mindfulness/physical exercise including walking, looking for more positives versus negatives daily, healthy nutrition and exercise, stop assuming worst case scenarios, practice consistent positive self talk, reduce overthinking and over analyzing, interrupt negative/anxious thoughts challenging them to replace with more reality-based thoughts, reflect on her progress daily, realize the strength she shows working with goal directed behaviors to move in a direction that supports her improved emotional health. ? ?Goal review and progress/challenges noted with patient. ? ?Next appointment within 3 to 4 weeks. ? ?This record has been created using [employer].  Chart creation errors have been sought, but may not always have been located and corrected.  Such creation errors do not reflect on the standard of medical care provided. ? ? ?Mathis Fare, LCSW ? ? ? ? ? ? ? ? ? ? ? ? ? ? ? ? ? ? ?

## 2021-12-04 ENCOUNTER — Ambulatory Visit: Payer: 59 | Admitting: Adult Health

## 2021-12-04 ENCOUNTER — Encounter: Payer: Self-pay | Admitting: Adult Health

## 2021-12-04 DIAGNOSIS — F411 Generalized anxiety disorder: Secondary | ICD-10-CM | POA: Diagnosis not present

## 2021-12-04 DIAGNOSIS — F99 Mental disorder, not otherwise specified: Secondary | ICD-10-CM

## 2021-12-04 DIAGNOSIS — F321 Major depressive disorder, single episode, moderate: Secondary | ICD-10-CM

## 2021-12-04 DIAGNOSIS — F5105 Insomnia due to other mental disorder: Secondary | ICD-10-CM

## 2021-12-04 MED ORDER — SERTRALINE HCL 50 MG PO TABS: 50.0000 mg | ORAL_TABLET | Freq: Every day | ORAL | 2 refills | Status: DC

## 2021-12-04 NOTE — Progress Notes (Signed)
[facility] MD/PA/NP Initial Note ? ?12/04/2021 1:12 PM ?Margaree MackintoshLori Bartolucci  ?MRN:  [id number] ? ?Chief Complaint:  ? ?HPI: ? ?Patient seen today for initial psychiatric evaluation. ? ?Describes mood today as "not good". Pleasant, but flat. Increased tearfulness. Mood symptoms - reports depression, anxiety, and irritability. Increased worry and rumination. Mood is low. Stating "I'm feeling like a failure" ?Also stating "I feel like whatever I do is never enough". Symptoms worsened after she [place] last October - symptoms started prior to the move and have continued to worsen. Mood gradually worsened with increased levels of responsibility given to her in the work setting - "not getting the training I need". Works full-time for an [employer] 21 years - 40 to 50 hours a week - remote position. Wanting to do a good job, but feels overwhelmed. Stating "I just feel like what I do isn't enough". Difficulties completing tasks - taking forever to do things. Unable to oraganize her thoughts - works with her team members, interacting and coordinating. Job requiring constant  responsiveness - "people know I'm off my game and I can do that because it interferes with company's needs". Has goals to meet - people's jobs on the line - "I take that seriously". Lacks enthusiasm - "I have to motivate people" Staying up late to meet demands - trying not to let team down and then doesn't sleep. Can't sleep when laying down because she is anxious and then thinking she needs to get up and do it. Taking longer to complete tasks. Currently on a leave of absence through early June. Decreased interest and motivation. Taking medications as prescribed. ?Energy levels poor - feels fatigued. 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 Active, does not have a regular exercise routine.  ?Enjoys some usual interests and activities. Married. Lives with husband and 3 children. Spending time with family. No  extended family local.  ?Appetite adequate. Weight stable - 203 pounds. ?Sleeping difficulties - up and down. Averages 5 hours of broken sleep. Denies day time napping. ?Focus and concentration Denies SI or HI.  ?Denies AH or VH. ?Reports recent self harm - hitting herself in the head once out of frustration ?Denies substance use. ? ?Trying to do mindfulness. Working on breathing exercises. Finding things she feels good about. Journaling. ? ?Previous medication trials:   ? ?Visit Diagnosis: No diagnosis found. ? ?Past Psychiatric History: Denies psychiatric hospitalization.  ? ?Past Medical History:  ?Past Medical History:  ?Diagnosis Date  ? Goals of care, counseling/discussion 09/09/2021  ? Iron deficiency anemia 09/09/2021  ? Menometrorrhagia 09/09/2021  ?  ?Past Surgical History:  ?Procedure Laterality Date  ? TUBAL LIGATION    ? ? ?Family Psychiatric History: Family history of mental illness - brother with depression.  ? ?Family History:  ?Family History  ?Problem Relation Age of Onset  ? Hypertension Mother   ? Diabetes Father   ? Hypertension Father   ? Hypertension Brother   ? ? ?Social History:  ?Social History  ? ?Socioeconomic History  ? Marital status: Married  ?  Spouse name: Not on file  ? Number of children: Not on file  ? Years of education: Not on file  ? Highest education level: Not on file  ?Occupational History  ? Not on file  ?Tobacco Use  ? Smoking status: Never  ? Smokeless tobacco: Never  ?Vaping Use  ? Vaping Use: Never used  ?Substance and Sexual Activity  ?  Alcohol use: Never  ? Drug use: Never  ? Sexual activity: Yes  ?  Birth control/protection: Surgical  ?Other Topics Concern  ? Not on file  ?Social History Narrative  ? Lives at home with husband and children.  Three children.      ? ?Social Determinants of Health  ? ?Financial Resource Strain: Not on file  ?Food Insecurity: Not on file  ?Transportation Needs: Not on file  ?Physical Activity: Not on file  ?Stress: Not on file  ?Social  Connections: Not on file  ? ? ?Allergies:  ?Allergies  ?Allergen Reactions  ? Iodine Shortness Of Breath  ? ? ?Metabolic Disorder Labs: ?No results found for: HGBA1C, MPG ?No results found for: PROLACTIN ?Lab Results  ?Component Value Date  ? CHOL 199 08/11/2021  ? TRIG 96 08/11/2021  ? HDL 66 08/11/2021  ? CHOLHDL 3.0 08/11/2021  ? LDLCALC 113 (H) 08/11/2021  ? ?Lab Results  ?Component Value Date  ? TSH 1.17 08/11/2021  ? ? ?Therapeutic Level Labs: ?No results found for: LITHIUM ?No results found for: VALPROATE ?No components found for:  CBMZ ? ?Current Medications: ?Current Outpatient Medications  ?Medication Sig Dispense Refill  ? Acyclovir (ZOVIRAX PO) Take by mouth as needed.    ? amLODipine (NORVASC) 5 MG tablet Take 1 tablet (5 mg total) by mouth daily. 90 tablet 0  ? ?No current facility-administered medications for this visit.  ? ? ?Medication Side Effects: none ? ?Orders placed this visit:  No orders of the defined types were placed in this encounter. ? ? ?Psychiatric Specialty Exam: ? ?Review of Systems  ?Musculoskeletal:  Negative for gait problem.  ?Neurological:  Negative for tremors.  ?Psychiatric/Behavioral:    ?     Please refer to HPI   ?There were no vitals taken for this visit.There is no height or weight on file to calculate BMI.  ?General Appearance: Casual and Neat  ?Eye Contact:  Good  ?Speech:  Clear and Coherent and Normal Rate  ?Volume:  Decreased  ?Mood:  Anxious and Depressed  ?Affect:  Depressed, Flat, Tearful, and Anxious  ?Thought Process:  Coherent and Descriptions of Associations: Intact  ?Orientation:  Full (Time, Place, and Person)  ?Thought Content: Logical   ?Suicidal Thoughts:  No  ?Homicidal Thoughts:  No  ?Memory:  WNL  ?Judgement:  Fair  ?Insight:  Fair  ?Psychomotor Activity:  Normal  ?Concentration:  Concentration: Fair and Attention Span: Fair  ?Recall:  Fair  ?Fund of Knowledge: Good  ?Language: Good  ?Assets:  Communication Skills ?Desire for Improvement ?Financial  Resources/Insurance ?Housing ?Intimacy ?Leisure Time ?Physical Health ?Resilience ?Social Support ?Talents/Skills ?Transportation ?Vocational/Educational  ?ADL's:  Impaired  ?Cognition: WNL  ?Prognosis:  Good  ? ?Screenings:  ?GAD-7   ? ?Flowsheet Row Office Visit from 08/11/2021 in [facility]  ?Total GAD-7 Score 1  ? ?  ? ?PHQ2-9   ? ?Flowsheet Row Office Visit from 08/11/2021 in [facility]  ?PHQ-2 Total Score 2  ?PHQ-9 Total Score 10  ? ?  ? ? ?Receiving Psychotherapy: Yes  ? ?Treatment Plan/Recommendations:  ? ?Plan: ? ?Add Zoloft 50mg  daily ? ?Patient totally disabled 12/04/21 through 01/05/2022. Will discuss a return to work date at appointment on 01/04/2022. ? ?Consider Vitamin D and B levels recent low iron levels ? ?Continue therapy with 03/06/2022 ? ?Time spent with patient was 60 minutes. Greater than 50% of face to face time with patient was  spent on counseling and coordination of care.   ? ?RTC 4 weeks ? ?Patient advised to contact office with any questions, adverse effects, or acute worsening in signs and symptoms. ?  ? ? ? ?Dorothyann Gibbs, NP ? ?         ?

## 2021-12-10 DIAGNOSIS — Z0289 Encounter for other administrative examinations: Secondary | ICD-10-CM

## 2021-12-11 ENCOUNTER — Telehealth: Payer: Self-pay | Admitting: Adult Health

## 2021-12-11 NOTE — Telephone Encounter (Signed)
Short term disability forms and records were faxed to [employer] 12/11/21 ?

## 2021-12-14 ENCOUNTER — Telehealth: Payer: Self-pay | Admitting: Adult Health

## 2021-12-14 NOTE — Telephone Encounter (Signed)
Received Short Term Disability form from [employer]. Placed in [place] box to complete.  ?

## 2021-12-15 NOTE — Telephone Encounter (Signed)
This form was already completed on Friday and faxed. They may have sent this on Friday before they received the other but I will refax to make sure. ?

## 2021-12-18 ENCOUNTER — Encounter: Payer: Self-pay | Admitting: Hematology & Oncology

## 2021-12-21 ENCOUNTER — Inpatient Hospital Stay: Payer: 59 | Admitting: Hematology & Oncology

## 2021-12-21 ENCOUNTER — Telehealth: Payer: 59 | Admitting: Emergency Medicine

## 2021-12-21 ENCOUNTER — Inpatient Hospital Stay: Payer: 59

## 2021-12-21 ENCOUNTER — Ambulatory Visit (INDEPENDENT_AMBULATORY_CARE_PROVIDER_SITE_OTHER): Payer: 59 | Admitting: Psychiatry

## 2021-12-21 DIAGNOSIS — J069 Acute upper respiratory infection, unspecified: Secondary | ICD-10-CM | POA: Diagnosis not present

## 2021-12-21 DIAGNOSIS — F321 Major depressive disorder, single episode, moderate: Secondary | ICD-10-CM

## 2021-12-21 MED ORDER — FLUTICASONE PROPIONATE 50 MCG/ACT NA SUSP: 2.0000 | Freq: Every day | NASAL | 0 refills | Status: AC

## 2021-12-21 MED ORDER — BENZONATATE 100 MG PO CAPS: 100.0000 mg | ORAL_CAPSULE | Freq: Two times a day (BID) | ORAL | 0 refills | Status: DC | PRN

## 2021-12-21 NOTE — Progress Notes (Signed)
E-Visit for Upper Respiratory Infection   We are sorry you are not feeling well.  Here is how we plan to help!  Based on what you have shared with me, it looks like you may have a viral upper respiratory infection.  Upper respiratory infections are caused by a large number of viruses; however, rhinovirus is the most common cause.   Your symptoms could be caused by COVID - please test yourself for COVID - a home rapid test is fine to use.   Symptoms vary from person to person, with common symptoms including sore throat, cough, fatigue or lack of energy and feeling of general discomfort.  A low-grade fever of up to 100.4 may present, but is often uncommon.  Symptoms vary however, and are closely related to a person's age or underlying illnesses.  The most common symptoms associated with an upper respiratory infection are nasal discharge or congestion, cough, sneezing, headache and pressure in the ears and face.  These symptoms usually persist for about 3 to 10 days, but can last up to 2 weeks.  It is important to know that upper respiratory infections do not cause serious illness or complications in most cases.    Upper respiratory infections can be transmitted from person to person, with the most common method of transmission being a person's hands.  The virus is able to live on the skin and can infect other persons for up to 2 hours after direct contact.  Also, these can be transmitted when someone coughs or sneezes; thus, it is important to cover the mouth to reduce this risk.  To keep the spread of the illness at bay, good hand hygiene is very important.  This is an infection that is most likely caused by a virus. There are no specific treatments other than to help you with the symptoms until the infection runs its course.  We are sorry you are not feeling well.  Here is how we plan to help!   Your ear pain is most likely caused by pressure from congestion. For nasal congestion, you may use an oral  decongestants such as Mucinex D or if you have glaucoma or high blood pressure use plain Mucinex.    Saline nasal spray or nasal drops can help and can safely be used as often as needed for congestion and should help relieve your ear pressure. Try using saline irrigation, such as with a neti pot, several times a day while you are sick. Many neti pots come with salt packets premeasured to use to make saline. If you use your own salt, make sure it is kosher salt or sea salt (don't use table salt as it has iodine in it and you don't need that in your nose). Use distilled water to make saline. If you mix your own saline using your own salt, the recipe is 1/4 teaspoon salt in 1 cup warm water. Using saline irrigation can help prevent and treat sinus infections.   For your congestion, I have prescribed Fluticasone nasal spray one spray in each nostril twice a day  If you do not have a history of heart disease, hypertension, diabetes or thyroid disease, prostate/bladder issues or glaucoma, you may also use Sudafed to treat nasal congestion.  It is highly recommended that you consult with a pharmacist or your primary care physician to ensure this medication is safe for you to take.     If you have a cough, you may use cough suppressants such as Delsym and  Robitussin.  If you have glaucoma or high blood pressure, you can also use Coricidin HBP.   For cough I have prescribed for you A prescription cough medication called Tessalon Perles 100 mg. You may take 1-2 capsules every 8 hours as needed for cough  If you have a sore or scratchy throat, use a saltwater gargle-  to  teaspoon of salt dissolved in a 4-ounce to 8-ounce glass of warm water.  Gargle the solution for approximately 15-30 seconds and then spit.  It is important not to swallow the solution.  You can also use throat lozenges/cough drops and Chloraseptic spray to help with throat pain or discomfort.  Warm or cold liquids can also be helpful in  relieving throat pain.  For headache, pain or general discomfort, you can use Ibuprofen or Tylenol as directed.   Some authorities believe that zinc sprays or the use of Echinacea may shorten the course of your symptoms.   HOME CARE Only take medications as instructed by your medical team. Be sure to drink plenty of fluids. Water is fine as well as fruit juices, sodas and electrolyte beverages. You may want to stay away from caffeine or alcohol. If you are nauseated, try taking small sips of liquids. How do you know if you are getting enough fluid? Your urine should be a pale yellow or almost colorless. Get rest. Taking a steamy shower or using a humidifier may help nasal congestion and ease sore throat pain. You can place a towel over your head and breathe in the steam from hot water coming from a faucet. Using a saline nasal spray works much the same way. Cough drops, hard candies and sore throat lozenges may ease your cough. Avoid close contacts especially the very young and the elderly Cover your mouth if you cough or sneeze Always remember to wash your hands.   GET HELP RIGHT AWAY IF: You develop worsening fever. If your symptoms do not improve within 10 days You develop yellow or green discharge from your nose over 3 days. You have coughing fits You develop a severe head ache or visual changes. You develop shortness of breath, difficulty breathing or start having chest pain Your symptoms persist after you have completed your treatment plan  MAKE SURE YOU  Understand these instructions. Will watch your condition. Will get help right away if you are not doing well or get worse.  Thank you for choosing an e-visit.  Your e-visit answers were reviewed by a board certified advanced clinical practitioner to complete your personal care plan. Depending upon the condition, your plan could have included both over the counter or prescription medications.  Please review your pharmacy  choice. Make sure the pharmacy is open so you can pick up prescription now. If there is a problem, you may contact your provider through [employer] and have the prescription routed to another pharmacy.  Your safety is important to [place]. If you have drug allergies check your prescription carefully.   For the next 24 hours you can use MyChart to ask questions about today's visit, request a non-urgent call back, or ask for a work or school excuse. You will get an email in the next two days asking about your experience. I hope that your e-visit has been valuable and will speed your recovery.   I have spent 5 minutes in review of e-visit questionnaire, review and updating patient chart, medical decision making and response to patient.   Rica Mast, PhD, FNP-BC

## 2021-12-21 NOTE — Progress Notes (Signed)
[facility] Counselor/Therapist Progress Note  Patient ID: Kim Gillespie, MRN: [id number],    Date: 12/21/2021  Time Spent: 55 minutes   Treatment Type: Individual Therapy  Reported Symptoms: some depression, some sleep difficulty, anxiety, some sadness but not sure what it's related to; denies any SI  Mental Status Exam:  Appearance:   Neat     Behavior:  Appropriate, Sharing, Motivated, and motivation some better but not every day  Motor:  Normal  Speech/Language:   Clear and Coherent  Affect:  Anxious and some depression although some better  Mood:  anxious, depressed, and sad  Thought process:  goal directed  Thought content:    WNL  Sensory/Perceptual disturbances:    WNL  Orientation:  oriented to person, place, time/date, situation, day of week, month of year, year, and stated date of Dec 21, 2021  Attention:  Good/Fair  Concentration:  Fair and Poor  Memory:  WNL  Fund of knowledge:   Good  Insight:    Good and Fair  Judgment:   Good  Impulse Control:  Good   Risk Assessment: Danger to Self:  No Self-injurious Behavior: No Danger to Others: No Duty to Warn:no Physical Aggression / Violence:No  Access to Firearms a concern: No  Gang Involvement:No   Subjective:  Patient in today reporting anxiety and depression (some better), some sleep difficulty. Some sadness but not sure why. Talks more about her sadness and states that it "does not stay all day but rather ebbs and flows." Having more hope "but still wondering if a stressful situation unexpectedly may happen where I feel overwhelmed." Denies that there's anything currently going on within the family/extended family that she should be worried about. Denies SI. Did talk with her kids (ages16, 51, 48) about what's "going on with me and was surprised that they were not more empathetic nor do they ever ask if I'm better." Processed her reaction to this in session today. Did show her positive attention on Mother's Day  and that was encouraging for patient.Talking through her issues including her anxiety and depression, and currently feeling some better and hoping to feel even more better. Some improvement but concerned will it continue. Faith very important to me, stopped praying but has now started back.Practiced more deep breathing exercises in session today and did better. Also encouraged positive self-talk and healthy boundaries mostly with colleagues. Husband supportive. Some friends are supportive. Does feel her medication is helping.   Interventions: Solution-Oriented/Positive Psychology, Ego-Supportive, and Insight-Oriented  Treatment goal plan: Patient not signing treatment plan on computer screen due to COVID. Treatment goals: Treatment goals remain on treatment plan as patient works with strategies to achieve her goals.  Progress is assessed each session and is documented in the "subject" and/or the "note" sections of treatment note. Long-term goal: Elevate mood and show evidence of usual energy, activities, and socialization level.  Alleviation of SI. Short-term goal: Verbalize an understanding of the relationship between repressed anger and depressed mood, and work with strategies to help patient express her anger in order to experience some alleviation of depressed mood. Strategies: Replace negative self-defeating self talk with verbalization of realistic and positive cognitive messages.  Diagnosis:   ICD-10-CM   1. Depression, major, single episode, moderate (HCC)  F32.1      Plan:  Patient today showing good participation and motivation as she worked on her anxiety, depression, and some sadness.  Having some increased hope for the future.  Trying to wind down  and go to bed at good times but still having some sleep difficulty.  This is first session that she has shown very much improvement so is feeling encouraged by that.  Has support from a coworker, and definitely from her husband, and a few  other friends.  Feels that her faith helps her through as well.  Encouraged her to continue her more positive self-care and self talk and to stay in touch with friends, looking more for what might go right versus wrong, and keeping healthier boundaries as needed. Encouraged to make the deep breathing exercises something she uses more regularly.  Some improved motivation but not every day, but grateful that it has improved some.  Affect not quite as depressed today. Encouraged patient in her trying to practice more goal-directed and positive behaviors including: Staying in the present focusing on what she can control or change, getting outside daily and walking, remaining in touch with people who are supportive, following through on some strategies discussed in sessions including deep breathing exercises/mindfulness/physical exercise including walking, looking for more positives versus negatives each day, healthy nutrition and exercise, stop assuming worst case scenarios, practice consistent positive self talk, reduce overthinking and over analyzing, interrupt negative/anxious thoughts and challenge them to replace with more reality-based thoughts, reflect on her own progress daily, recognize the strength she shows working with goal directed behaviors to move in a direction that supports her improved emotional health and overall wellbeing.  Goal review and progress/challenges noted with patient.  Next appointment within 2 to 3 weeks.  This record has been created using [employer].  Chart creation errors have been sought, but may not always have been located and corrected.  Such creation errors do not reflect on the standard of medical care provided.   Mathis Fare, LCSW

## 2022-01-05 ENCOUNTER — Ambulatory Visit: Payer: 59 | Admitting: Psychiatry

## 2022-01-06 ENCOUNTER — Encounter: Payer: Self-pay | Admitting: Adult Health

## 2022-01-06 ENCOUNTER — Ambulatory Visit: Payer: 59 | Admitting: Adult Health

## 2022-01-06 DIAGNOSIS — F5105 Insomnia due to other mental disorder: Secondary | ICD-10-CM

## 2022-01-06 DIAGNOSIS — F411 Generalized anxiety disorder: Secondary | ICD-10-CM

## 2022-01-06 DIAGNOSIS — F321 Major depressive disorder, single episode, moderate: Secondary | ICD-10-CM | POA: Diagnosis not present

## 2022-01-06 DIAGNOSIS — F99 Mental disorder, not otherwise specified: Secondary | ICD-10-CM

## 2022-01-06 NOTE — Progress Notes (Addendum)
Kim Gillespie [id number] 12/14/1970 51 y.o.  Subjective:   Patient ID:  Kim Gillespie is a 51 y.o. (DOB Jun 22, 1971) female.  Chief Complaint: No chief complaint on file.   HPI Kim Gillespie presents to the office today for follow-up of MDD, GAD, and insomnia.  Describes mood today as "about the same". Pleasant, but flat. Tearful. Mood symptoms - reports depression, anxiety, and irritability. Increased worry and rumination. Reports over thinking. Mood is lower. Stating "I'm afraid I'm going to fail and people are going to know". Feelings of not being "enough". Started on Zoloft at 50mg  and has tolerated it - some noted benefit - reporting some mild side effects - willing to continue. Doing minimal tasks around the house. Mostly staying home. Reports ongoing difficulties completing tasks. Feels disorganized - working to manage her thoughts and feelings. Working with therapist regularly. Decreased interest and motivation. Taking medications as prescribed. Energy levels poor. Active, does not have a regular exercise routine. Enjoys some usual interests and activities. Married. Lives with husband and 3 children. Spending time with family. No extended family local.  Appetite adequate. Weight stable - 203 pounds. Sleeping difficulties - waking up at 2 to 3 every night. Averages 5 hours of broken sleep. Denies daytime napping. Focus and concentration  Denies SI or HI. Reports some passive SI. Denies AH or VH. Reports recent self harm. Denies substance use.  Previous medication trials:  Denies   GAD-7    Flowsheet Row Office Visit from 08/11/2021 in [facility]  Total GAD-7 Score 1      PHQ2-9    Flowsheet Row Office Visit from 08/11/2021 in [facility]  PHQ-2 Total Score 2  PHQ-9 Total Score 10        Review of Systems:  Review of Systems  Musculoskeletal:  Negative for gait problem.  Neurological:  Negative for  tremors.  Psychiatric/Behavioral:         Please refer to HPI    Medications: I have reviewed the patient's current medications.  Current Outpatient Medications  Medication Sig Dispense Refill   Acyclovir (ZOVIRAX PO) Take by mouth as needed.     amLODipine (NORVASC) 5 MG tablet Take 1 tablet (5 mg total) by mouth daily. 90 tablet 0   benzonatate (TESSALON) 100 MG capsule Take 1 capsule (100 mg total) by mouth 2 (two) times daily as needed for cough. 20 capsule 0   fluticasone (FLONASE) 50 MCG/ACT nasal spray Place 2 sprays into both nostrils daily. 16 g 0   sertraline (ZOLOFT) 50 MG tablet Take 1 tablet (50 mg total) by mouth daily. 30 tablet 2   No current facility-administered medications for this visit.    Medication Side Effects: None  Allergies:  Allergies  Allergen Reactions   Iodine Shortness Of Breath    Past Medical History:  Diagnosis Date   Goals of care, counseling/discussion 09/09/2021   Iron deficiency anemia 09/09/2021   Menometrorrhagia 09/09/2021    Past Medical History, Surgical history, Social history, and Family history were reviewed and updated as appropriate.   Please see review of systems for further details on the patient's review from today.   Objective:   Physical Exam:  There were no vitals taken for this visit.  Physical Exam  Lab Review:     Component Value Date/Time   NA 137 10/29/2021 0934   K 3.9 10/29/2021 0934   CL 105 10/29/2021 0934   CO2 24 10/29/2021 0934  GLUCOSE 116 (H) 10/29/2021 0934   BUN 14 10/29/2021 0934   CREATININE 0.83 10/29/2021 0934   CREATININE 0.74 08/11/2021 0000   CALCIUM 9.4 10/29/2021 0934   PROT 7.2 10/29/2021 0934   ALBUMIN 4.5 10/29/2021 0934   AST 13 (L) 10/29/2021 0934   ALT 14 10/29/2021 0934   ALKPHOS 68 10/29/2021 0934   BILITOT 0.3 10/29/2021 0934   GFRNONAA >60 10/29/2021 0934       Component Value Date/Time   WBC 6.9 10/29/2021 0934   WBC 7.9 08/11/2021 0000   RBC 4.99 10/29/2021 0934    HGB 13.0 10/29/2021 0934   HCT 41.5 10/29/2021 0934   PLT 263 10/29/2021 0934   MCV 83.2 10/29/2021 0934   MCH 26.1 10/29/2021 0934   MCHC 31.3 10/29/2021 0934   RDW 23.7 (H) 10/29/2021 0934   LYMPHSABS 1.4 10/29/2021 0934   MONOABS 0.3 10/29/2021 0934   EOSABS 0.1 10/29/2021 0934   BASOSABS 0.0 10/29/2021 0934    No results found for: "POCLITH", "LITHIUM"   No results found for: "PHENYTOIN", "PHENOBARB", "VALPROATE", "CBMZ"   .res Assessment: Plan:   Plan:  Continue Zoloft 50mg  daily  Patient totally disabled 01/06/22 through 02/13/2022. Will discuss a return to work date at appointment at next appointment.  Continue therapy with 02/15/2022  Time spent with patient was 25 minutes. Greater than 50% of face to face time with patient was spent on counseling and coordination of care.    RTC 4 weeks  Patient advised to contact office with any questions, adverse effects, or acute worsening in signs and symptoms.   Diagnoses and all orders for this visit:  Depression, major, single episode, moderate (HCC)  Insomnia due to other mental disorder  Generalized anxiety disorder     Please see After Visit Summary for patient specific instructions.  Future Appointments  Date Time Provider Department Center  01/21/2022  4:00 PM 01/23/2022, Mathis Fare CP-CP None  02/04/2022 11:00 AM 04/07/2022, LCSW CP-CP None  02/08/2022  1:40 PM Lean Fayson, 04/11/2022, NP CP-CP None    No orders of the defined types were placed in this encounter.   -------------------------------

## 2022-01-07 ENCOUNTER — Telehealth: Payer: Self-pay | Admitting: Adult Health

## 2022-01-07 NOTE — Telephone Encounter (Signed)
Received STD forms. Placed in Traci's box 6/8

## 2022-01-10 NOTE — Telephone Encounter (Signed)
Paper work completed and will have Rene Kocher review and sign on Monday 6/12

## 2022-01-11 NOTE — Telephone Encounter (Signed)
Noted. Pls see note.

## 2022-01-21 ENCOUNTER — Ambulatory Visit: Payer: 59 | Admitting: Psychiatry

## 2022-01-21 DIAGNOSIS — F321 Major depressive disorder, single episode, moderate: Secondary | ICD-10-CM

## 2022-01-21 NOTE — Progress Notes (Signed)
[facility] Counselor/Therapist Progress Note  Patient ID: Kim Gillespie, MRN: [id number],    Date: 01/21/2022  Time Spent:  55 minutes  Treatment Type: Individual Therapy  Reported Symptoms:  depression (improving), anxiety, not sleeping, some improvement in depression and anxiety  Mental Status Exam:  Appearance:   Casual     Behavior:  Appropriate, Sharing, and Motivated  Motor:  Normal  Speech/Language:   Clear and Coherent  Affect:  Depressed and anxious  Mood:  anxious and depressed  Thought process:  goal directed  Thought content:    WNL  Sensory/Perceptual disturbances:    WNL  Orientation:  oriented to person, place, time/date, situation, day of week, month of year, year, and stated date of January 21, 2022.  Attention:  Sometimes good and sometimes fair  Concentration:  Good and Fair  Memory:  WNL  Fund of knowledge:   Good  Insight:    Good  Judgment:   Good  Impulse Control:  Good   Risk Assessment: Danger to Self:  No Self-injurious Behavior: No Danger to Others: No Duty to Warn:no Physical Aggression / Violence:No  Access to Firearms a concern: No  Gang Involvement:No   Subjective:  Patient in today and reports anxiety and depression, and both are decreasing some. Not as sad about some things such as older kids "not needing me as much" and now seeing that as a normal maturing process and actually something she has helped them be able to do and feel good about. Shared and discussed her reactions to certain things she sees in person or on TV that bothered her and discussed this in more detail today which seemed to help her feel more grounded. Still some sleep difficulty, maybe 4-5 hours, and plans to speak with med provider about this. Continues to feel some increased hope and trying to make decisions for better self-care. Uses her faith as a comfort, and adds that prayer helps her also.  Encouraged to use some deep breathing exercises also as practiced in  previous session.  Did talk some about returning to work at some point part-time and we explored this together and she agreed that part-time would be best initially, but not returning to work just yet.  Worked with patient on stopping some self doubt, having good boundaries in all areas of life, and affirmed her in her improved self-care and boundary setting regarding work, along with setting limits with others whenever needed.  Interventions: Cognitive Behavioral Therapy and Solution-Oriented/Positive Psychology  Treatment goal plan: Patient not signing treatment plan on computer screen due to COVID. Treatment goals: Treatment goals remain on treatment plan as patient works with strategies to achieve her goals.  Progress is assessed each session and is documented in the "subject" and/or the "note" sections of treatment note. Long-term goal: Elevate mood and show evidence of usual energy, activities, and socialization level.  Alleviation of SI. Short-term goal: Verbalize an understanding of the relationship between repressed anger and depressed mood, and work with strategies to help patient express her anger in order to experience some alleviation of depressed mood. Strategies: Replace negative self-defeating self talk with verbalization of realistic and positive cognitive messages.  Diagnosis:   ICD-10-CM   1. Depression, major, single episode, moderate (HCC)  F32.1      Plan: Patient today showing good motivation and participation in session as she focused on her depression and anxiety which she reports both are decreasing some.  Also reflected in her progress is her  starting to have better boundaries and better self-care, along with more realistic expectations. Affect more positive.  She continues to need treatment in order to make further progress in these areas and each of her goal directed behaviors especially in further reducing her depression and anxiety and being able to set healthier  limits for herself and get better sleep as we talked about several things that may help improve her sleep or her getting back to sleep when she wakes up.  Was not particularly wanting to try more medication just yet but agrees that if her sleep does not improve soon she will definitely speak with her med provider.  Patient shares that she "does have more hope".  Encouraged to do some journaling in between sessions, as well as setting appropriate limits with others, and maintaining some time just for herself each day as she pursues her goals and works to make further progress.  Is reflecting on going back to work and agrees that starting back half time feels like a good starting point for her and will also talk with her med provider about this.  To continue her more positive self-care and self-talk as she stays in touch with friends, looks more for what might go right versus wrong, maintain healthy boundaries with others as needed, use the deep breathing exercises more regularly when she feels they would be helpful, and noticing her positives more often. Encouraged patient in her practice of goal directed positive behaviors including: Staying in the present focusing on what she can control or change, remaining in touch with people who are supportive, following through on some strategies discussed in sessions including deep breathing exercises, mindfulness, physical exercise including walking, looking for more positives versus negatives each day, healthy nutrition and exercise, stop assuming worst case scenarios, practice consistent positive self talk, reduce overthinking and over analyzing, interrupt negative/anxious thoughts and challenge them to replace with more reality-based thoughts, reflect on her own progress often, recognize the strength she shows working with goal directed behaviors to move in a direction that supports her improved emotional health.  Goal review and progress/challenges noted with  patient.  Next appointment within 3 weeks.  This record has been created using [employer].  Chart creation errors have been sought, but may not always have been located and corrected.  Such creation errors do not reflect on the standard of medical care provided.   Mathis Fare, LCSW

## 2022-02-01 ENCOUNTER — Ambulatory Visit: Payer: 59 | Admitting: Psychiatry

## 2022-02-02 ENCOUNTER — Other Ambulatory Visit: Payer: Self-pay | Admitting: Adult Health

## 2022-02-02 DIAGNOSIS — F321 Major depressive disorder, single episode, moderate: Secondary | ICD-10-CM

## 2022-02-02 DIAGNOSIS — F411 Generalized anxiety disorder: Secondary | ICD-10-CM

## 2022-02-02 DIAGNOSIS — F99 Mental disorder, not otherwise specified: Secondary | ICD-10-CM

## 2022-02-04 ENCOUNTER — Ambulatory Visit: Payer: 59 | Admitting: Psychiatry

## 2022-02-08 ENCOUNTER — Ambulatory Visit: Payer: 59 | Admitting: Adult Health

## 2022-02-08 ENCOUNTER — Encounter: Payer: Self-pay | Admitting: Adult Health

## 2022-02-08 ENCOUNTER — Ambulatory Visit (INDEPENDENT_AMBULATORY_CARE_PROVIDER_SITE_OTHER): Payer: 59 | Admitting: Adult Health

## 2022-02-08 DIAGNOSIS — F411 Generalized anxiety disorder: Secondary | ICD-10-CM

## 2022-02-08 DIAGNOSIS — F99 Mental disorder, not otherwise specified: Secondary | ICD-10-CM

## 2022-02-08 DIAGNOSIS — F5105 Insomnia due to other mental disorder: Secondary | ICD-10-CM | POA: Diagnosis not present

## 2022-02-08 DIAGNOSIS — F321 Major depressive disorder, single episode, moderate: Secondary | ICD-10-CM

## 2022-02-08 NOTE — Progress Notes (Signed)
Kim Gillespie [id number] 1970-12-15 51 y.o.  Subjective:   Patient ID:  Kim Gillespie is a 51 y.o. (DOB 1971-01-02) female.  Chief Complaint: No chief complaint on file.   HPI Kim Gillespie presents to the office today for follow-up of MDD, GAD, and insomnia.  Describes mood today as "about the same". Pleasant, but flat. Tearful. Mood symptoms - reports depression and  anxiety. Reports irritability. Reports worry and rumination. Reports over thinking. Mood fluctuates - having good and bad days. Feels stagnant - "staying in the moment". Continues to take Zoloft and feels it has been helpful to make her "emotionally stronger". Doing minimal tasks around the house. Mostly staying home. Working with therapist regularly. Decreased interest and motivation. Taking medications as prescribed. Energy levels lower. Active, does not have a regular exercise routine. Walking. Enjoys some usual interests and activities. Married. Lives with husband and 3 children. Spending time with family. No extended family local.  Appetite adequate. Weight loss 198 from 203 pounds. Sleeping difficulties. Averages 5 hours. Denies daytime napping. Focus and concentration  Denies SI or HI. Reports some passive SI. Denies AH or VH. Reports recent self harm. Denies substance use.  Previous medication trials:  Denies   GAD-7    Flowsheet Row Office Visit from 08/11/2021 in [facility]  Total GAD-7 Score 1      PHQ2-9    Flowsheet Row Office Visit from 08/11/2021 in [facility]  PHQ-2 Total Score 2  PHQ-9 Total Score 10        Review of Systems:  Review of Systems  Musculoskeletal:  Negative for gait problem.  Neurological:  Negative for tremors.  Psychiatric/Behavioral:         Please refer to HPI    Medications: I have reviewed the patient's current medications.  Current Outpatient Medications  Medication Sig Dispense Refill    Acyclovir (ZOVIRAX PO) Take by mouth as needed.     amLODipine (NORVASC) 5 MG tablet Take 1 tablet (5 mg total) by mouth daily. 90 tablet 0   benzonatate (TESSALON) 100 MG capsule Take 1 capsule (100 mg total) by mouth 2 (two) times daily as needed for cough. 20 capsule 0   fluticasone (FLONASE) 50 MCG/ACT nasal spray Place 2 sprays into both nostrils daily. 16 g 0   sertraline (ZOLOFT) 50 MG tablet TAKE 1 TABLET BY MOUTH EVERY DAY 90 tablet 1   No current facility-administered medications for this visit.    Medication Side Effects: None  Allergies:  Allergies  Allergen Reactions   Iodine Shortness Of Breath    Past Medical History:  Diagnosis Date   Goals of care, counseling/discussion 09/09/2021   Iron deficiency anemia 09/09/2021   Menometrorrhagia 09/09/2021    Past Medical History, Surgical history, Social history, and Family history were reviewed and updated as appropriate.   Please see review of systems for further details on the patient's review from today.   Objective:   Physical Exam:  There were no vitals taken for this visit.  Physical Exam Constitutional:      General: She is not in acute distress. Musculoskeletal:        General: No deformity.  Neurological:     Mental Status: She is alert and oriented to person, place, and time.     Coordination: Coordination normal.  Psychiatric:        Attention and Perception: Attention and perception normal. She does not perceive auditory or visual hallucinations.  Mood and Affect: Mood normal. Mood is not anxious or depressed. Affect is not labile, blunt, angry or inappropriate.        Speech: Speech normal.        Behavior: Behavior normal.        Thought Content: Thought content normal. Thought content is not paranoid or delusional. Thought content does not include homicidal or suicidal ideation. Thought content does not include homicidal or suicidal plan.        Cognition and Memory: Cognition and memory normal.         Judgment: Judgment normal.     Comments: Insight intact     Lab Review:     Component Value Date/Time   NA 137 10/29/2021 0934   K 3.9 10/29/2021 0934   CL 105 10/29/2021 0934   CO2 24 10/29/2021 0934   GLUCOSE 116 (H) 10/29/2021 0934   BUN 14 10/29/2021 0934   CREATININE 0.83 10/29/2021 0934   CREATININE 0.74 08/11/2021 0000   CALCIUM 9.4 10/29/2021 0934   PROT 7.2 10/29/2021 0934   ALBUMIN 4.5 10/29/2021 0934   AST 13 (L) 10/29/2021 0934   ALT 14 10/29/2021 0934   ALKPHOS 68 10/29/2021 0934   BILITOT 0.3 10/29/2021 0934   GFRNONAA >60 10/29/2021 0934       Component Value Date/Time   WBC 6.9 10/29/2021 0934   WBC 7.9 08/11/2021 0000   RBC 4.99 10/29/2021 0934   HGB 13.0 10/29/2021 0934   HCT 41.5 10/29/2021 0934   PLT 263 10/29/2021 0934   MCV 83.2 10/29/2021 0934   MCH 26.1 10/29/2021 0934   MCHC 31.3 10/29/2021 0934   RDW 23.7 (H) 10/29/2021 0934   LYMPHSABS 1.4 10/29/2021 0934   MONOABS 0.3 10/29/2021 0934   EOSABS 0.1 10/29/2021 0934   BASOSABS 0.0 10/29/2021 0934    No results found for: "POCLITH", "LITHIUM"   No results found for: "PHENYTOIN", "PHENOBARB", "VALPROATE", "CBMZ"   .res Assessment: Plan:    Plan:  Continue Zoloft 50mg  daily  Patient may return to work on 02/15/2022 on a part time basis for 4 weeks. An increase in work load will be assessed at next visit in 4 weeks.   Continue therapy with 02/17/2022  Time spent with patient was 25 minutes. Greater than 50% of face to face time with patient was spent on counseling and coordination of care.    RTC 4 weeks  Patient advised to contact office with any questions, adverse effects, or acute worsening in signs and symptoms. Diagnoses and all orders for this visit:  Depression, major, single episode, moderate (HCC)  Insomnia due to other mental disorder  Generalized anxiety disorder     Please see After Visit Summary for patient specific instructions.  Future Appointments   Date Time Provider Department Center  02/12/2022  8:00 AM 02/14/2022, LCSW CP-CP None  02/25/2022  3:00 PM 02/27/2022, LCSW CP-CP None  03/17/2022  4:00 PM 03/19/2022, LCSW CP-CP None    No orders of the defined types were placed in this encounter.   -------------------------------

## 2022-02-12 ENCOUNTER — Ambulatory Visit: Payer: 59 | Admitting: Psychiatry

## 2022-02-12 DIAGNOSIS — F321 Major depressive disorder, single episode, moderate: Secondary | ICD-10-CM

## 2022-02-12 NOTE — Progress Notes (Signed)
[facility] Counselor/Therapist Progress Note  Patient ID: Kim Gillespie, MRN: [id number],    Date: 02/12/2022  Time Spent: 56 minutes   Treatment Type: Individual Therapy  Reported Symptoms: depression, anxiety, "a little brain-foggy"  Mental Status Exam:  Appearance:   Casual     Behavior:  Appropriate, Sharing, and Motivated  Motor:  Normal  Speech/Language:   Clear and Coherent  Affect:  Appropriate  Mood:  anxious and some depression but improved  Thought process:  goal directed  Thought content:    WNL  Sensory/Perceptual disturbances:    WNL  Orientation:  oriented to person, place, time/date, situation, day of week, month of year, year, and stated date of February 12, 2022  Attention:  Good  Concentration:  Good  Memory:  WNL  Fund of knowledge:   Good  Insight:    Good  Judgment:   Good  Impulse Control:  Good   Risk Assessment: Danger to Self:  No Self-injurious Behavior: No Danger to Others: No Duty to Warn:no Physical Aggression / Violence:No  Access to Firearms a concern: No  Gang Involvement:No   Subjective:  Patient in today reporting that she plans to return to work next week July 19th, and is planning how to return and get off to a healthy start including strategies we discussed today:  healthy boundaries, being able to say no when needed, not always feeling she has to be the person to always pick up the slack, trying to allow for enough sleep, staying in the present, healthy nutrition, positive self-talk, not dwelling on past mistakes, ask for what she needs, deep breathing exercises, refrain from looking for the negatives or worst case scenarios, use her faith as a resource emotionally and spiritually, reflect on her positives,breathe, focus on her strengths versus perceived weaknesses. Processed these in session today and patient took notes as we talked, including specific examples for her.   Interventions: Cognitive Behavioral Therapy and  Ego-Supportive  Treatment goals: Treatment goals remain on treatment plan as patient works with strategies to achieve her goals.  Progress is assessed each session and is documented in the "subject" and/or the "note" sections of treatment note. Long-term goal: Elevate mood and show evidence of usual energy, activities, and socialization level.  Alleviation of SI. Short-term goal: Verbalize an understanding of the relationship between repressed anger and depressed mood, and work with strategies to help patient express her anger in order to experience some alleviation of depressed mood. Strategies: Replace negative self-defeating self talk with verbalization of realistic and positive cognitive messages.  Diagnosis:   ICD-10-CM   1. Depression, major, single episode, moderate (HCC)  F32.1      Plan:  Patient today showing good participation and motivation as she worked more on her depression and anxiety both of which are improving some.  Did well in session today focusing on her plan to return to work part-time next Wednesday.  Talked through several work related issues that are typically stressful for her and how she might confront them in healthier manners than she did previously.  Also worked together on a good list of strategies to use in returning to work, including specific examples, and also ways of responding to certain questions that she may be asked by work [employer].  Reflected with patient on her progress which she feels good about.  Affect is definitely more positive although some anxiety as she plans to return back to work and is grateful that it is part-time initially.  States she does have more hope.  Continue to encourage patient to use journaling between sessions that has been helpful to her in the past and coping more effectively. Encouraged patient in her practice of positive behaviors including: Focusing on what she can control or change, staying in the present versus the past or  into the future, mindfulness exercises, deep breathing exercises, remaining in touch with people who are supportive, following through on strategies discussed in session, physical exercise including walking, looking for more positives versus negatives daily, healthy nutrition and exercise, refrain from assuming worst case scenarios, positive self talk, maintain healthy boundaries with others, positive self-care including staying in touch with friends, reduce overthinking and over analyzing, interrupt negative/anxious thoughts and challenge them to replace with more reality based and empowering thoughts, reflect on her progress often, and realize the strength she shows working with goal directed behaviors to move in a direction that supports her improved emotional health and overall outlook.  Goal review and progress/challenges noted with patient.  Next appointment within 2 weeks.  This record has been created using [employer].  Chart creation errors have been sought, but may not always have been located and corrected.  Such creation errors do not reflect on the standard of medical care provided.     Mathis Fare, LCSW

## 2022-02-25 ENCOUNTER — Ambulatory Visit: Payer: 59 | Admitting: Psychiatry

## 2022-03-01 ENCOUNTER — Ambulatory Visit: Payer: 59 | Admitting: Psychiatry

## 2022-03-01 DIAGNOSIS — F411 Generalized anxiety disorder: Secondary | ICD-10-CM | POA: Diagnosis not present

## 2022-03-01 NOTE — Progress Notes (Signed)
[facility] Counselor/Therapist Progress Note  Patient ID: Kim Gillespie, MRN: [id number],    Date: 03/01/2022  Time Spent: 55 minutes   Treatment Type: Individual Therapy  Reported Symptoms: anxiety, depression  Mental Status Exam:  Appearance:   Neat     Behavior:  Appropriate, Sharing, and Motivated  Motor:  Normal  Speech/Language:   Clear and Coherent  Affect:  Depressed and anxious  Mood:  anxious and depressed  Thought process:  goal directed  Thought content:    WNL  Sensory/Perceptual disturbances:    WNL  Orientation:  oriented to person, place, time/date, situation, day of week, month of year, year, and stated date of March 01, 2022  Attention:  Fair  Concentration:  Good and Fair  Memory:  Sometimes forget where I put things, writing things down really helps  Fund of knowledge:   Good  Insight:    Good  Judgment:   Good  Impulse Control:  Good   Risk Assessment: Danger to Self:  No Self-injurious Behavior: No Danger to Others: No Duty to Warn:no Physical Aggression / Violence:No  Access to Firearms a concern: No  Gang Involvement:No   Subjective:  Patient in today reporting that starting back to work part-time within past 2 weeks. Does "get tired and some anxiety about her to-do list". Job, 3 days a week, and all remote from home. Feels that she is managing at work and really having to work at it and set some boundaries as needed. Some mixed feelings about school starting back up soon and 2 older kids moving out or moving into dorm. Typically experiences some time-limited sadness and then moves on. Affect today includes some depression, sadness, and also some appropriate laughter at times. Healthy boundaries and communicating those as needed, tired at times and is waking up some middle of the night "but I was always having trouble sleeping before I ever came here." Husband is supportive. Asked about her self-talk and she has been practicing the exercise we did  at last session which is helpful. Able to say "no" more when needed and set appropriate limits. Working on "not always picking up the slack for everybody else." Handling past mistakes better and not "over think", staying more in the present, using her faith as a helpful resource.  Interventions: Cognitive Behavioral Therapy  Treatment goals: Treatment goals remain on treatment plan as patient works with strategies to achieve her goals.  Progress is assessed each session and is documented in the "subject" and/or the "note" sections of treatment note. Long-term goal: Elevate mood and show evidence of usual energy, activities, and socialization level.  Alleviation of SI. Short-term goal: Verbalize an understanding of the relationship between repressed anger and depressed mood, and work with strategies to help patient express her anger in order to experience some alleviation of depressed mood. Strategies: Replace negative self-defeating self talk with verbalization of realistic and positive cognitive messages.   Diagnosis:   ICD-10-CM   1. Generalized anxiety disorder  F41.1      Plan: Patient today motivated for therapy and active participation working with strategies to decrease her depression and anxiety, create more belief in herself, and feel more self-confident again as she progresses "but not quite there yet." Has returned to work part time. Worried about "what's next". Encouraged to stay in the present for now, and is receptive to this.  Was able to laugh some towards the end of session when we were talking about some of the  things that she confronts, and actually acknowledged how laughter can be an important part in her healing and moving forward.  Has good list of strategies that she can use and confronting obstacles each day and "coaching herself" through this time of return to part-time employment.  Feels her supervisor is being respectful of her and not having unrealistic expectations of  her.  Affect and behavior were more positive today but feels she is "on her way to feeling better and in the midst of following through on goal-directed behaviors to feel better herself and more confident at work."  Patient to continue her connections to other people, journaling between sessions as needed, and reflecting more on her positives and her strengths in believing in herself. Encouraged patient in her practice of positive behaviors including: Staying in the present versus the past or too far into the future, focusing what she can control or change, mindfulness exercises, deep breathing exercises, remaining in touch with people who are supportive, following through on strategies discussed in sessions, physical exercise including walking, looking for more positives versus negatives daily, healthy nutrition and exercise, refrain from assuming worst case scenarios, positive self talk and self-care, maintain healthy boundaries with others, reduce overthinking and over analyzing, interrupt negative/anxious thoughts and challenge them to replace with more reality based thoughts, reflect on the progress she has made often, and recognize the strength she shows working with goal directed behaviors to move in a direction that supports her improved emotional health.  Goal review and progress/challenges noted with patient.  Next appointment within 2 weeks.  This record has been created using [employer].  Chart creation errors have been sought, but may not always have been located and corrected.  Such creation errors do not reflect on the standard of medical care provided.   Mathis Fare, LCSW

## 2022-03-08 ENCOUNTER — Ambulatory Visit: Payer: 59 | Admitting: Adult Health

## 2022-03-08 DIAGNOSIS — Z0289 Encounter for other administrative examinations: Secondary | ICD-10-CM

## 2022-03-09 ENCOUNTER — Telehealth: Payer: Self-pay | Admitting: Adult Health

## 2022-03-09 NOTE — Telephone Encounter (Addendum)
Pt advised forms completed and ready to pick up. Pt requested [place] to fax to [facility]  Fax: [phone] on form. Pt to pick up copy today.

## 2022-03-09 NOTE — Telephone Encounter (Signed)
Received fax from [employer] for completion of Behavioral Health Form on [place]. Placed in Traci's box to complete.

## 2022-03-12 ENCOUNTER — Ambulatory Visit: Payer: 59 | Admitting: Adult Health

## 2022-03-12 ENCOUNTER — Encounter: Payer: Self-pay | Admitting: Adult Health

## 2022-03-12 DIAGNOSIS — F99 Mental disorder, not otherwise specified: Secondary | ICD-10-CM | POA: Diagnosis not present

## 2022-03-12 DIAGNOSIS — F411 Generalized anxiety disorder: Secondary | ICD-10-CM | POA: Diagnosis not present

## 2022-03-12 DIAGNOSIS — F321 Major depressive disorder, single episode, moderate: Secondary | ICD-10-CM

## 2022-03-12 DIAGNOSIS — F5105 Insomnia due to other mental disorder: Secondary | ICD-10-CM | POA: Diagnosis not present

## 2022-03-12 NOTE — Progress Notes (Signed)
Kim Gillespie [id number] 05/14/1971 50 y.o.  Subjective:   Patient ID:  Kim Gillespie is a 51 y.o. (DOB [birth year]) female.  Chief Complaint: No chief complaint on file.   HPI Kim Gillespie presents to the office today for follow-up of MDD, GAD, and insomnia.  Describes mood today as "a little better. Pleasant, but flat. Tearful. Mood symptoms - reports decreased depression,  irritability and anxiety. Reports ongoing worry and rumination. Reports over thinking. Mood fluctuates - having more good days than bad days. Stating "I think I'm improving - taking one day at a time". Continues to take Zoloft and feels it has been helpful. Completing minimal tasks around the house. Mostly staying home. Working with therapist regularly. Decreased interest and motivation. Taking medications as prescribed. Energy levels lower. Active, does not have a regular exercise routine. Walking some days - not as much. Enjoys some usual interests and activities. Married. Lives with husband and 3 children - oldest child moving out and other daughter moving out of the stat to college. Spending time with family. No extended family local.  Appetite adequate. Weight loss 198 pounds. Sleeping difficulties. Averages 6 to 7 hours on days she works and longer on days she is off. Denies daytime napping. Focus and concentration difficulties. Completing tasks. Managing some aspects of household. Has returned to work 8 hours a day - 3 days a week - feels like this is a good schedule for now - not ready to return on a full time basis.  Denies SI or HI. Less SI - "not as frequent". Denies AH or VH. Reports recent self harm. Denies substance use.  Previous medication trials:  Denies   GAD-7    Flowsheet Row Office Visit from 08/11/2021 in [facility]  Total GAD-7 Score 1      PHQ2-9    Flowsheet Row Office Visit from 08/11/2021 in [facility]  PHQ-2 Total  Score 2  PHQ-9 Total Score 10        Review of Systems:  Review of Systems  Musculoskeletal:  Negative for gait problem.  Neurological:  Negative for tremors.  Psychiatric/Behavioral:         Please refer to HPI    Medications: I have reviewed the patient's current medications.  Current Outpatient Medications  Medication Sig Dispense Refill   Acyclovir (ZOVIRAX PO) Take by mouth as needed.     amLODipine (NORVASC) 5 MG tablet Take 1 tablet (5 mg total) by mouth daily. 90 tablet 0   benzonatate (TESSALON) 100 MG capsule Take 1 capsule (100 mg total) by mouth 2 (two) times daily as needed for cough. 20 capsule 0   fluticasone (FLONASE) 50 MCG/ACT nasal spray Place 2 sprays into both nostrils daily. 16 g 0   sertraline (ZOLOFT) 50 MG tablet TAKE 1 TABLET BY MOUTH EVERY DAY 90 tablet 1   No current facility-administered medications for this visit.    Medication Side Effects: None  Allergies:  Allergies  Allergen Reactions   Iodine Shortness Of Breath    Past Medical History:  Diagnosis Date   Goals of care, counseling/discussion 09/09/2021   Iron deficiency anemia 09/09/2021   Menometrorrhagia 09/09/2021    Past Medical History, Surgical history, Social history, and Family history were reviewed and updated as appropriate.   Please see review of systems for further details on the patient's review from today.   Objective:   Physical Exam:  There were no vitals taken for  this visit.  Physical Exam Constitutional:      General: She is not in acute distress. Musculoskeletal:        General: No deformity.  Neurological:     Mental Status: She is alert and oriented to person, place, and time.     Coordination: Coordination normal.  Psychiatric:        Attention and Perception: Attention and perception normal. She does not perceive auditory or visual hallucinations.        Mood and Affect: Mood normal. Mood is not anxious or depressed. Affect is not labile, blunt, angry or  inappropriate.        Speech: Speech normal.        Behavior: Behavior normal.        Thought Content: Thought content normal. Thought content is not paranoid or delusional. Thought content does not include homicidal or suicidal ideation. Thought content does not include homicidal or suicidal plan.        Cognition and Memory: Cognition and memory normal.        Judgment: Judgment normal.     Comments: Insight intact     Lab Review:     Component Value Date/Time   NA 137 10/29/2021 0934   K 3.9 10/29/2021 0934   CL 105 10/29/2021 0934   CO2 24 10/29/2021 0934   GLUCOSE 116 (H) 10/29/2021 0934   BUN 14 10/29/2021 0934   CREATININE 0.83 10/29/2021 0934   CREATININE 0.74 08/11/2021 0000   CALCIUM 9.4 10/29/2021 0934   PROT 7.2 10/29/2021 0934   ALBUMIN 4.5 10/29/2021 0934   AST 13 (L) 10/29/2021 0934   ALT 14 10/29/2021 0934   ALKPHOS 68 10/29/2021 0934   BILITOT 0.3 10/29/2021 0934   GFRNONAA >60 10/29/2021 0934       Component Value Date/Time   WBC 6.9 10/29/2021 0934   WBC 7.9 08/11/2021 0000   RBC 4.99 10/29/2021 0934   HGB 13.0 10/29/2021 0934   HCT 41.5 10/29/2021 0934   PLT 263 10/29/2021 0934   MCV 83.2 10/29/2021 0934   MCH 26.1 10/29/2021 0934   MCHC 31.3 10/29/2021 0934   RDW 23.7 (H) 10/29/2021 0934   LYMPHSABS 1.4 10/29/2021 0934   MONOABS 0.3 10/29/2021 0934   EOSABS 0.1 10/29/2021 0934   BASOSABS 0.0 10/29/2021 0934    No results found for: "POCLITH", "LITHIUM"   No results found for: "PHENYTOIN", "PHENOBARB", "VALPROATE", "CBMZ"   .res Assessment: Plan:    Plan:  Continue Zoloft 50mg  daily  Patient is to continue current work schedule starting 03/12/2022 - 3 days a week - 8 hours a day. An increase in work load will be assessed at next visit in 4 weeks.   Continue therapy with 05/12/2022  Time spent with patient was 25 minutes. Greater than 50% of face to face time with patient was spent on counseling and coordination of care.    RTC 4  weeks  Patient advised to contact office with any questions, adverse effects, or acute worsening in signs and symptoms. Diagnoses and all orders for this visit:  Generalized anxiety disorder  Depression, major, single episode, moderate (HCC)  Insomnia due to other mental disorder     Please see After Visit Summary for patient specific instructions.  Future Appointments  Date Time Provider Department Center  03/17/2022  4:00 PM 03/19/2022, LCSW [facility] None  04/09/2022  9:00 AM 06/09/2022, LCSW [facility] None  04/09/2022 10:20 AM Haizel Gatchell, 06/09/2022, NP [facility] None  04/26/2022  9:00 AM Mathis Fare, LCSW [facility] None    No orders of the defined types were placed in this encounter.   -------------------------------

## 2022-03-17 ENCOUNTER — Ambulatory Visit (INDEPENDENT_AMBULATORY_CARE_PROVIDER_SITE_OTHER): Payer: 59 | Admitting: Psychiatry

## 2022-03-17 DIAGNOSIS — F411 Generalized anxiety disorder: Secondary | ICD-10-CM

## 2022-03-17 NOTE — Progress Notes (Signed)
[facility] Counselor/Therapist Progress Note  Patient ID: Kim Gillespie, MRN: [id number],    Date: 03/17/2022  Time Spent: 55 minutes   Treatment Type: Individual Therapy  Reported Symptoms: anxiety, some depression "but better", sometimes some "brain fog and fatigue"  Mental Status Exam:  Appearance:   Casual     Behavior:  Appropriate, Sharing, and Motivated  Motor:  Normal  Speech/Language:   Clear and Coherent  Affect:  Anxious, some depression  Mood:  anxious and depressed  Thought process:  goal directed  Thought content:    Some overthinking  Sensory/Perceptual disturbances:    WNL  Orientation:  oriented to person, place, time/date, situation, day of week, month of year, year, and stated date of March 17, 2022  Attention:  Good  Concentration:  Good  Memory:  "More like some brain fog at times"  Fund of knowledge:   Good  Insight:    Good  Judgment:   Good  Impulse Control:  Good   Risk Assessment: Danger to Self:  No Self-injurious Behavior: No Danger to Others: No Duty to Warn:no Physical Aggression / Violence:No  Access to Firearms a concern: No  Gang Involvement:No   Subjective: Patient in today reporting some improvement in depression, still having some anxiety, "brain fog at times" and fatigue. Some nights I don't sleep as well, usually the night before going to work. States that work is supportive environment.  She realizes at times she needs to look ahead at her schedule and carve out some time for herself. Is written for reduced hours at work for 4 more weeks and will then be re-assessed. Affect is more full today but still having some times when she's more anxious "but better".  And actually able to laugh some which felt good to her.  Did discuss her getting out of the house more when she is home and also thinking of some activities that might bring her pleasure including new activities which she seemed interested in.  States she is also gotten  interested recently in pets and is "possibly going to look at a dog this week that she saw online".  Husband and teenage kids remains supportive.  Continues more positive self talk which she admits has been helpful.  To continue being able to set appropriate limits with others and including saying "no" as needed.  Also to continue having healthy boundaries at work, as she tries to decrease her overthinking, while keeping her expectations of herself realistic and healthy as discussed today.  Interventions: Cognitive Behavioral Therapy and Ego-Supportive  Long-term goal: Elevate mood and show evidence of usual energy, activities, and socialization level.  Alleviation of SI. Short-term goal: Verbalize an understanding of the relationship between repressed anger and depressed mood, and work with strategies to help patient express her anger in order to experience some alleviation of depressed mood. Strategies: Replace negative self-defeating self talk with verbalization of realistic and positive cognitive messages.  Diagnosis:   ICD-10-CM   1. Generalized anxiety disorder  F41.1      Plan:   Patient today showing active participation and good motivation as she worked further on her anxiety and depression but feels that she is making some progress as noted above family being supportive is helpful.  Also finding more recently that she is able to laugh at times which feels good.  Remains very motivated and focusing more on more positive self-care including self talk, setting limits for herself and with others, and trying to  have realistic expectations for herself, realizing that her getting better is a process.  To continue goal directed behaviors and build on some of the progress she is already experiencing.  Self-confidence seems to be gradually improving.  Is doing well and using some strategies discussed.  Trying to coach herself through this time of working part-time and getting her mindset in a better  place, and also figuring out what helps her feel some joy and a sense of gradual recovering which is a work in progress currently.  Encouraged to continue goal directed behaviors as well as her connections to other people, while reflecting on her strengths and positives.  Is very grateful that her family is supportive. Encouraged patient in her practice of positive behaviors including: Focusing on what she can control or change, using mindfulness exercises and deep breathing exercises, staying in the present versus the past or too far into the future, remaining in touch with people who are supportive, following through on strategies discussed in sessions, physical exercise including walking, looking for more positives versus negatives daily, healthy nutrition and exercise, refrain from assuming worst case scenarios, positive self talk and self-care, maintain healthy boundaries with others, reduce overthinking and over analyzing, interrupt negative/anxious thoughts and challenge them to replace with more reality based thoughts, reflect on the progress she has made, and realize the strength she shows working with goal directed behaviors to move in a direction that supports her improved emotional health and overall wellbeing.  Goal review and progress/challenges noted with patient.  Next appointment within 2 to 3 weeks.  This record has been created using [employer].  Chart creation errors have been sought, but may not always have been located and corrected.  Such creation errors do not reflect on the standard of medical care provided.   Mathis Fare, LCSW

## 2022-03-22 NOTE — Telephone Encounter (Signed)
FMLA updated and given to [facility] to review and sign

## 2022-03-23 DIAGNOSIS — Z0289 Encounter for other administrative examinations: Secondary | ICD-10-CM

## 2022-04-09 ENCOUNTER — Ambulatory Visit: Payer: 59 | Admitting: Psychiatry

## 2022-04-09 ENCOUNTER — Encounter: Payer: Self-pay | Admitting: Adult Health

## 2022-04-09 ENCOUNTER — Ambulatory Visit: Payer: 59 | Admitting: Adult Health

## 2022-04-09 DIAGNOSIS — F411 Generalized anxiety disorder: Secondary | ICD-10-CM

## 2022-04-09 DIAGNOSIS — F5105 Insomnia due to other mental disorder: Secondary | ICD-10-CM

## 2022-04-09 DIAGNOSIS — F321 Major depressive disorder, single episode, moderate: Secondary | ICD-10-CM

## 2022-04-09 DIAGNOSIS — F99 Mental disorder, not otherwise specified: Secondary | ICD-10-CM | POA: Diagnosis not present

## 2022-04-09 NOTE — Progress Notes (Signed)
[facility] Counselor/Therapist Progress Note  Patient ID: Kim Gillespie, MRN: [id number],    Date: 04/09/2022  Time Spent: 55 minutes   Treatment Type: Individual Therapy  Reported Symptoms: anxiety (decreasing), depression (decreasing)  Mental Status Exam:  Appearance:   Casual     Behavior:  Appropriate, Sharing, and Motivated  Motor:  Normal  Speech/Language:   Clear and Coherent  Affect:  Anxious, depressed "but both are decreasing"  Mood:  anxious and depressed  Thought process:  goal directed  Thought content:    Some overthinking  Sensory/Perceptual disturbances:    WNL  Orientation:  oriented to person, place, time/date, situation, day of week, month of year, year, and stated date of Sept. 8, 2023  Attention:  Good  Concentration:  Good  Memory:  WNL  Fund of knowledge:   Good  Insight:    Good  Judgment:   Good  Impulse Control:  Good   Risk Assessment: Danger to Self:  No Self-injurious Behavior: No Danger to Others: No Duty to Warn:no Physical Aggression / Violence:No  Access to Firearms a concern: No  Gang Involvement:No   Subjective:   Patient in today reporting improvement in anxiety and depression and "learning to cope with them better". Trying to understand why I feel certain ways at times, and "not always sure what starts these feelings, and am learning to be more self award of my emotions and my thoughts." Still working on "self-doubt" and trying to let go of it, and it's worse during times of increased stress."Starting to see some increased strength, and when I have a worse day I dont' feel it's insurmountable." Seeing some progress but not ready to increase work hours yet. Not crying as much. Able to laugh at some things. Looked at a dog recently but opted not to take dog right now.  Plans to go back at a later time but does feel that might be a good addition to the home and could have a positive emotional effect as well.  To think more of activities  that might bring her pleasure and in which she has interest.  Husband and teenage children remains supportive.  To continue positive self talk which she has said previously helps.  Continue setting limits with others and saying "no" as needed in order to maintain healthy boundaries.  Trying to overthink less and keep expectations healthy and reasonable.  Interventions: Cognitive Behavioral Therapy and Ego-Supportive   Long-term goal: Elevate mood and show evidence of usual energy, activities, and socialization level.  Alleviation of SI. Short-term goal: Verbalize an understanding of the relationship between repressed anger and depressed mood, and work with strategies to help patient express her anger in order to experience some alleviation of depressed mood. Strategies: Replace negative self-defeating self talk with verbalization of realistic and positive cognitive messages.  Diagnosis:   ICD-10-CM   1. Generalized anxiety disorder  F41.1      Plan: Patient today showing motivation and active participation in session as she processes further her anxiety and depression and acknowledging that both are decreasing some.  Enjoys family activities.  Reports work is going ok.  Likes her current schedule of working 3 days and being off 4 days which gives her time for herself as she works on getting better (less depressed and less anxious), and working on having a better sense of what she would like to do going forward.  Continues positive self-care and working on positive self talk, setting limits for  herself and with others.  Being aware of realistic expectations for herself.  Building on the progress that she has already made, very gradually.  Feels that working part-time has helping her try to get her mindset "in a better place".  Is making some progress as noted above and needs continued therapy to further work on goal directed behaviors, become less depressed and less anxious, and eventually return to a  level of work that feels comfortable for her and her needs, as well as enjoying more outside activities including being with other people and her ability to reflect on her strengths/positives. Encouraged patient in her practice of positive behaviors including: Focusing on what she can control or change, using mindfulness exercises and deep breathing exercises during times of increased anxiety and stress, remaining in the present versus the past or too far into the future, staying in touch with people who are supportive, following through on strategies discussed in sessions, physical exercise including walking, looking for more positives versus negatives daily, healthy nutrition and exercise, refrain from assuming worst case scenarios, positive self talk and self-care, maintain healthy boundaries with others, reduce overthinking and over analyzing, interrupt negative/anxious thoughts and challenge them to replace with more reality-based thoughts, reflect on the progress she has already made, and recognize the strength she shows working with goal directed behaviors to move in a direction that supports her improved emotional health.  Goal review and progress/challenges noted with patient.  Next appointment within 2 weeks.  This record has been created using [employer].  Chart creation errors have been sought, but may not always have been located and corrected.  Such creation errors do not reflect on the standard of medical care provided.   Mathis Fare, LCSW

## 2022-04-09 NOTE — Progress Notes (Signed)
Kim Gillespie [id number] 07-16-1971 51 y.o.  Subjective:   Patient ID:  Kim Gillespie is a 51 y.o. (DOB Feb 25, 1971) female.  Chief Complaint: No chief complaint on file.   HPI Kim Gillespie presents to the office today for follow-up of MDD, GAD and insomnia.  Describes mood today as "improving". Pleasant. Tearful at times. Mood symptoms - reports decreased depression,  irritability and anxiety - "still having issues at times". Reports worry and rumination. Reports over thinking. Mood fluctuates. Reports having more good days than bad. Feels like the Zoloft at 50mg  is helpful - would like to give it more time to reach the maximum benefit. Completing more tasks around the house. Still staying at home. Working with therapist regularly. Decreased interest and motivation. Taking medications as prescribed. Energy levels lower - feeling tired. Active, does not have a regular exercise routine. Walking some days - plans to do more. Enjoys some usual interests and activities. Married. Lives with husband - 3 children. Spending time with family. No extended family local.  Appetite adequate. Weight loss 198 pounds. Sleeping difficulties. Averages 6 to 7 hours Denies daytime napping. Focus and concentration difficulties. Feels challenged with remembering things or thinking of the correct word - "I'm not able to remember things the way I did". Completing tasks. Managing some aspects of household. Has returned to work 8 hours a day - 3 days a week - does not feel like she is ready to advance schedule at this time.  Denies SI or HI. Less SI - "less frequent". Denies AH or VH. Reports recent self harm. Denies substance use.  Previous medication trials:  Denies   GAD-7    Flowsheet Row Office Visit from 08/11/2021 in [facility]  Total GAD-7 Score 1      PHQ2-9    Flowsheet Row Office Visit from 08/11/2021 in [facility]  PHQ-2 Total  Score 2  PHQ-9 Total Score 10        Review of Systems:  Review of Systems  Musculoskeletal:  Negative for gait problem.  Neurological:  Negative for tremors.  Psychiatric/Behavioral:         Please refer to HPI    Medications: I have reviewed the patient's current medications.  Current Outpatient Medications  Medication Sig Dispense Refill   Acyclovir (ZOVIRAX PO) Take by mouth as needed.     amLODipine (NORVASC) 5 MG tablet Take 1 tablet (5 mg total) by mouth daily. 90 tablet 0   benzonatate (TESSALON) 100 MG capsule Take 1 capsule (100 mg total) by mouth 2 (two) times daily as needed for cough. 20 capsule 0   fluticasone (FLONASE) 50 MCG/ACT nasal spray Place 2 sprays into both nostrils daily. 16 g 0   sertraline (ZOLOFT) 50 MG tablet TAKE 1 TABLET BY MOUTH EVERY DAY 90 tablet 1   No current facility-administered medications for this visit.    Medication Side Effects: None  Allergies:  Allergies  Allergen Reactions   Iodine Shortness Of Breath    Past Medical History:  Diagnosis Date   Goals of care, counseling/discussion 09/09/2021   Iron deficiency anemia 09/09/2021   Menometrorrhagia 09/09/2021    Past Medical History, Surgical history, Social history, and Family history were reviewed and updated as appropriate.   Please see review of systems for further details on the patient's review from today.   Objective:   Physical Exam:  There were no vitals taken for this visit.  Physical Exam  Constitutional:      General: She is not in acute distress. Musculoskeletal:        General: No deformity.  Neurological:     Mental Status: She is alert and oriented to person, place, and time.     Coordination: Coordination normal.  Psychiatric:        Attention and Perception: Attention and perception normal. She does not perceive auditory or visual hallucinations.        Mood and Affect: Mood normal. Mood is not anxious or depressed. Affect is not labile, blunt, angry or  inappropriate.        Speech: Speech normal.        Behavior: Behavior normal.        Thought Content: Thought content normal. Thought content is not paranoid or delusional. Thought content does not include homicidal or suicidal ideation. Thought content does not include homicidal or suicidal plan.        Cognition and Memory: Cognition and memory normal.        Judgment: Judgment normal.     Comments: Insight intact     Lab Review:     Component Value Date/Time   NA 137 10/29/2021 0934   K 3.9 10/29/2021 0934   CL 105 10/29/2021 0934   CO2 24 10/29/2021 0934   GLUCOSE 116 (H) 10/29/2021 0934   BUN 14 10/29/2021 0934   CREATININE 0.83 10/29/2021 0934   CREATININE 0.74 08/11/2021 0000   CALCIUM 9.4 10/29/2021 0934   PROT 7.2 10/29/2021 0934   ALBUMIN 4.5 10/29/2021 0934   AST 13 (L) 10/29/2021 0934   ALT 14 10/29/2021 0934   ALKPHOS 68 10/29/2021 0934   BILITOT 0.3 10/29/2021 0934   GFRNONAA >60 10/29/2021 0934       Component Value Date/Time   WBC 6.9 10/29/2021 0934   WBC 7.9 08/11/2021 0000   RBC 4.99 10/29/2021 0934   HGB 13.0 10/29/2021 0934   HCT 41.5 10/29/2021 0934   PLT 263 10/29/2021 0934   MCV 83.2 10/29/2021 0934   MCH 26.1 10/29/2021 0934   MCHC 31.3 10/29/2021 0934   RDW 23.7 (H) 10/29/2021 0934   LYMPHSABS 1.4 10/29/2021 0934   MONOABS 0.3 10/29/2021 0934   EOSABS 0.1 10/29/2021 0934   BASOSABS 0.0 10/29/2021 0934    No results found for: "POCLITH", "LITHIUM"   No results found for: "PHENYTOIN", "PHENOBARB", "VALPROATE", "CBMZ"   .res Assessment: Plan:    Plan:  Continue Zoloft 50mg  daily  Patient is to continue current work schedule started on 03/12/2022 - 3 days a week - 8 hours a day. An increase in work load will be assessed at next visit in 4 weeks.   Continue therapy with 05/12/2022  Time spent with patient was 25 minutes. Greater than 50% of face to face time with patient was spent on counseling and coordination of care.    RTC 4  weeks  Patient advised to contact office with any questions, adverse effects, or acute worsening in signs and symptoms.  Diagnoses and all orders for this visit:  Generalized anxiety disorder  Depression, major, single episode, moderate (HCC)  Insomnia due to other mental disorder     Please see After Visit Summary for patient specific instructions.  Future Appointments  Date Time Provider Department Center  04/26/2022  9:00 AM 04/28/2022, LCSW [facility] None  05/17/2022  3:00 PM 05/19/2022, LCSW [facility] None  06/14/2022 11:00 AM 06/16/2022, LCSW [facility] None    No  orders of the defined types were placed in this encounter.   -------------------------------

## 2022-04-12 ENCOUNTER — Telehealth: Payer: Self-pay | Admitting: Adult Health

## 2022-04-12 DIAGNOSIS — Z0289 Encounter for other administrative examinations: Secondary | ICD-10-CM

## 2022-04-12 NOTE — Telephone Encounter (Signed)
Received [employer] forms. Put in Traci's box 9/11

## 2022-04-14 NOTE — Telephone Encounter (Signed)
Paper work completed and given to Regina to review and sign ?

## 2022-04-14 NOTE — Telephone Encounter (Signed)
Faxed her paper work and office notes per request to [employer]

## 2022-04-26 ENCOUNTER — Ambulatory Visit (INDEPENDENT_AMBULATORY_CARE_PROVIDER_SITE_OTHER): Payer: 59 | Admitting: Psychiatry

## 2022-04-26 DIAGNOSIS — F411 Generalized anxiety disorder: Secondary | ICD-10-CM

## 2022-04-26 NOTE — Progress Notes (Signed)
[facility] Counselor/Therapist Progress Note  Patient ID: Kim Gillespie, MRN: [id number],    Date: 04/26/2022  Time Spent: 55 minutes   Treatment Type: Individual Therapy  Reported Symptoms: anxiety, tired, depression, "anxiety and depression some manageable ok" but still feel "unsure" but "try to stay in the present"  Mental Status Exam:  Appearance:   Casual and Neat     Behavior:  Appropriate, Sharing, and Motivated  Motor:  Normal  Speech/Language:   Clear and Coherent  Affect:  Depressed and anxious  Mood:  anxious and depressed  Thought process:  goal directed  Thought content:    Overthinking and some negative thinking  Sensory/Perceptual disturbances:    WNL  Orientation:  oriented to person, place, time/date, situation, day of week, month of year, year, and stated date of Sept. 25, 2023  Attention:  Fair  Concentration:  Good and Fair  Memory:  [place] of knowledge:   Good  Insight:    Good  Judgment:   Good  Impulse Control:  Good   Risk Assessment: Danger to Self:  No Self-injurious Behavior: No Danger to Others: No Duty to Warn:no Physical Aggression / Violence:No  Access to Firearms a concern: No  Gang Involvement:No   Subjective:  Patient today reporting continued anxiety and depression and not much progress recently, "still trying to sort things out".  Trying to see her needs for transition at work as something positive. Still tired although sometimes sleeping better "for the most part."  Self-doubt "about the same" but better than it was months ago. Wanting to be more self-aware of her emotions and thoughts. Continues work on self-doubt and trying to believe more in herself. Working on not seeing her getting help and trying in reduce her work hours as a bad thing but rather a healthy move that is needed for herself. Finds buying flowers and planting them enjoyable. Encouraging more contact with friends. Denies any SI.  Recognizing again that even on a  really bad day, she "does not feel it is insurmountable" and that seems to help give her strength.  Acknowledges "I am able to laugh at some things, do not cry as much."  Still thinks about off and on fostering a dog.  Reframing some of her thoughts about making changes at work to be more self care versus "a step down".  That seems to be significant for patient.  Encouraged to continue her positive self talk and make it more regular as well as maintaining healthy boundaries and work to interrupt her over thinking and negative directions.  Interventions: Cognitive Behavioral Therapy and Ego-Supportive   Long-term goal: Elevate mood and show evidence of usual energy, activities, and socialization level.  Alleviation of SI. Short-term goal: Verbalize an understanding of the relationship between repressed anger and depressed mood, and work with strategies to help patient express her anger in order to experience some alleviation of depressed mood. Strategies: Replace negative self-defeating self talk with verbalization of realistic and positive cognitive messages.  Diagnosis:   ICD-10-CM   1. Generalized anxiety disorder  F41.1      Plan:  Patient in today showing good participation and motivation in session as she worked further on her anxiety and depression and "sorting things out that relate to it".  Today has been a little more down than other days and states that she is still trying to figure out what she wants to do going forward.  Still having some self-doubt and wanting  to be more self aware of her emotions and thoughts which she did work on some in session today.  Trying to see her getting help and reducing her work hours in a more positive light versus a way of framing herself negatively.  Enjoys family time together.  Has stated previously that working part-time has helped her get her mindset in a better place but more recently has been a little more self doubting at times, although not all the  time.  Does share that she does not feel her situation is "insurmountable" and trying to accept the fact that "I may need to stick with a 3 or 4-day work week" and not see it as a failure on her part but rather as more proactive self-care. Encouraged patient in practicing more positive behaviors including: Continue working with strategies that are helping alleviate her anxiety and depression, enjoy family activities, focus on what she can change or control, using deep breathing and mindfulness exercises during times of increased stress and anxiety, remain in the present versus the past or jumping ahead to the future, staying in touch with people who are supportive, following through on strategies discussed in sessions that help her cope, physical exercise including walking, look for more positives versus negatives each day, healthy nutrition and exercise, refrain from assuming worst case scenarios, positive self talk and self-care, healthy boundaries with others, reduce overthinking and over analyzing, reflect on the progress she has already made, and realize the strength she shows working with goal directed behaviors to move in a direction that supports her improved emotional health.  Goal review and progress/challenges noted with patient.  Next appointment within 2 to 3 weeks.  This record has been created using [employer].  Chart creation errors have been sought, but may not always have been located and corrected.  Such creation errors do not reflect on the standard of medical care provided.   Shanon Ace, LCSW

## 2022-04-30 ENCOUNTER — Inpatient Hospital Stay: Payer: 59

## 2022-04-30 ENCOUNTER — Inpatient Hospital Stay: Payer: 59 | Attending: Hematology & Oncology | Admitting: Hematology & Oncology

## 2022-04-30 ENCOUNTER — Telehealth: Payer: Self-pay

## 2022-04-30 ENCOUNTER — Encounter: Payer: Self-pay | Admitting: Hematology & Oncology

## 2022-04-30 VITALS — BP 147/98 | HR 74 | Temp 98.1°F | Resp 20 | Ht 65.5 in | Wt 194.1 lb

## 2022-04-30 DIAGNOSIS — D500 Iron deficiency anemia secondary to blood loss (chronic): Secondary | ICD-10-CM | POA: Diagnosis not present

## 2022-04-30 DIAGNOSIS — N921 Excessive and frequent menstruation with irregular cycle: Secondary | ICD-10-CM | POA: Diagnosis not present

## 2022-04-30 LAB — CBC WITH DIFFERENTIAL (CANCER CENTER ONLY)
Abs Immature Granulocytes: 0.11 10*3/uL — ABNORMAL HIGH (ref 0.00–0.07)
Basophils Absolute: 0 10*3/uL (ref 0.0–0.1)
Basophils Relative: 1 %
Eosinophils Absolute: 0.1 10*3/uL (ref 0.0–0.5)
Eosinophils Relative: 1 %
HCT: 35.8 % — ABNORMAL LOW (ref 36.0–46.0)
Hemoglobin: 11 g/dL — ABNORMAL LOW (ref 12.0–15.0)
Immature Granulocytes: 2 %
Lymphocytes Relative: 20 %
Lymphs Abs: 1.4 10*3/uL (ref 0.7–4.0)
MCH: 24.6 pg — ABNORMAL LOW (ref 26.0–34.0)
MCHC: 30.7 g/dL (ref 30.0–36.0)
MCV: 79.9 fL — ABNORMAL LOW (ref 80.0–100.0)
Monocytes Absolute: 0.5 10*3/uL (ref 0.1–1.0)
Monocytes Relative: 8 %
Neutro Abs: 5 10*3/uL (ref 1.7–7.7)
Neutrophils Relative %: 68 %
Platelet Count: 346 10*3/uL (ref 150–400)
RBC: 4.48 MIL/uL (ref 3.87–5.11)
RDW: 14.7 % (ref 11.5–15.5)
WBC Count: 7.2 10*3/uL (ref 4.0–10.5)
nRBC: 0 % (ref 0.0–0.2)

## 2022-04-30 LAB — IRON AND IRON BINDING CAPACITY (CC-WL,HP ONLY)
Iron: 19 ug/dL — ABNORMAL LOW (ref 28–170)
Saturation Ratios: 4 % — ABNORMAL LOW (ref 10.4–31.8)
TIBC: 503 ug/dL — ABNORMAL HIGH (ref 250–450)
UIBC: 484 ug/dL — ABNORMAL HIGH (ref 148–442)

## 2022-04-30 LAB — CMP (CANCER CENTER ONLY)
ALT: 14 U/L (ref 0–44)
AST: 15 U/L (ref 15–41)
Albumin: 4.3 g/dL (ref 3.5–5.0)
Alkaline Phosphatase: 63 U/L (ref 38–126)
Anion gap: 5 (ref 5–15)
BUN: 13 mg/dL (ref 6–20)
CO2: 30 mmol/L (ref 22–32)
Calcium: 9.5 mg/dL (ref 8.9–10.3)
Chloride: 103 mmol/L (ref 98–111)
Creatinine: 0.74 mg/dL (ref 0.44–1.00)
GFR, Estimated: >60 mL/min (ref >60)
Glucose, Bld: 96 mg/dL (ref 70–99)
Potassium: 3.7 mmol/L (ref 3.5–5.1)
Sodium: 138 mmol/L (ref 135–145)
Total Bilirubin: 0.4 mg/dL (ref 0.3–1.2)
Total Protein: 7.3 g/dL (ref 6.5–8.1)

## 2022-04-30 LAB — RETICULOCYTES
Immature Retic Fract: 21.5 % — ABNORMAL HIGH (ref 2.3–15.9)
RBC.: 4.35 MIL/uL (ref 3.87–5.11)
Retic Count, Absolute: 49.2 10*3/uL (ref 19.0–186.0)
Retic Ct Pct: 1.1 % (ref 0.4–3.1)

## 2022-04-30 LAB — FERRITIN: Ferritin: 4 ng/mL — ABNORMAL LOW (ref 11–307)

## 2022-04-30 NOTE — Progress Notes (Signed)
Hematology and Oncology Follow Up Visit  Kim Gillespie [id number] 04/30/1971 51 y.o. 04/30/2022   Principle Diagnosis:  Iron deficiency anemia secondary to menometrorrhagia  Current Therapy:   IV iron-Venofer given on 10/01/2021     Interim History:  Kim Gillespie is back for follow-up.  She does feel little bit tired.  She also has little bit of a "fog" with the brain.  I just wonder if her iron is back down.  Her MCV is lower.  She still has her monthly cycles.  She finished a couple days ago.  She has had no obvious bleeding otherwise.  She cannot remember when her last colonoscopy was.  From our records, last one that she had done was back in January 2022.  When we last saw her, her ferritin was 50 with an iron saturation of 11%.  She has had no cough or shortness of breath.  She does get tired.  There is no nausea or vomiting.  Told her to take some folic acid.  I told her to take over-the-counter folic acid..  She does does take vitamin B12 at home.  I told her to make sure she takes 5000 mcg a day.  She has had no obvious change in bowel or bladder habits.  There is been no rashes.  She has had no leg swelling.  There is no tingling in her hands or feet.  Overall, I would say performance status is probably ECOG 1.    Medications:  Current Outpatient Medications:    Acyclovir (ZOVIRAX PO), Take by mouth as needed., Disp: , Rfl:    amLODipine (NORVASC) 5 MG tablet, Take 1 tablet (5 mg total) by mouth daily., Disp: 90 tablet, Rfl: 0   fluticasone (FLONASE) 50 MCG/ACT nasal spray, Place 2 sprays into both nostrils daily., Disp: 16 g, Rfl: 0   sertraline (ZOLOFT) 50 MG tablet, TAKE 1 TABLET BY MOUTH EVERY DAY, Disp: 90 tablet, Rfl: 1  Allergies:  Allergies  Allergen Reactions   Iodine Shortness Of Breath    Past Medical History, Surgical history, Social history, and Family History were reviewed and updated.  Review of Systems: Review of Systems  Constitutional: Negative.    HENT:  Negative.    Eyes: Negative.   Respiratory: Negative.    Cardiovascular: Negative.   Gastrointestinal: Negative.   Endocrine: Negative.   Genitourinary: Negative.    Musculoskeletal: Negative.   Skin: Negative.   Neurological: Negative.   Hematological: Negative.   Psychiatric/Behavioral: Negative.      Physical Exam:  height is 5' 5.5" (1.664 m) and weight is 194 lb 1.3 oz (88 kg). Her oral temperature is 98.1 F (36.7 C). Her blood pressure is 147/98 (abnormal) and her pulse is 74. Her respiration is 20 and oxygen saturation is 100%.   Wt Readings from Last 3 Encounters:  04/30/22 194 lb 1.3 oz (88 kg)  10/29/21 203 lb 12.8 oz (92.4 kg)  09/09/21 202 lb 12.8 oz (92 kg)    Physical Exam Vitals reviewed.  HENT:     Head: Normocephalic and atraumatic.  Eyes:     Pupils: Pupils are equal, round, and reactive to light.  Cardiovascular:     Rate and Rhythm: Normal rate and regular rhythm.     Heart sounds: Normal heart sounds.  Pulmonary:     Effort: Pulmonary effort is normal.     Breath sounds: Normal breath sounds.  Abdominal:     General: Bowel sounds are normal.  Palpations: Abdomen is soft.  Musculoskeletal:        General: No tenderness or deformity. Normal range of motion.     Cervical back: Normal range of motion.  Lymphadenopathy:     Cervical: No cervical adenopathy.  Skin:    General: Skin is warm and dry.     Findings: No erythema or rash.  Neurological:     Mental Status: She is alert and oriented to person, place, and time.  Psychiatric:        Behavior: Behavior normal.        Thought Content: Thought content normal.        Judgment: Judgment normal.      Lab Results  Component Value Date   WBC 7.2 04/30/2022   HGB 11.0 (L) 04/30/2022   HCT 35.8 (L) 04/30/2022   MCV 79.9 (L) 04/30/2022   PLT 346 04/30/2022     Chemistry      Component Value Date/Time   NA 137 10/29/2021 0934   K 3.9 10/29/2021 0934   CL 105 10/29/2021 0934    CO2 24 10/29/2021 0934   BUN 14 10/29/2021 0934   CREATININE 0.83 10/29/2021 0934   CREATININE 0.74 08/11/2021 0000      Component Value Date/Time   CALCIUM 9.4 10/29/2021 0934   ALKPHOS 68 10/29/2021 0934   AST 13 (L) 10/29/2021 0934   ALT 14 10/29/2021 0934   BILITOT 0.3 10/29/2021 0934       Impression and Plan: Kim Gillespie is a very nice 51 year old Afro-American female.  She was profoundly iron deficient and will refer saw her.  Ferritin was less than 4.  She has had 3 doses of Venofer.  I suspect that she is pregnant need some more IV iron.  Again, the MCV is lower.  I told her that she probably can need iron intermittently as long as she has her monthly cycles.  This concerning going on for another several years.  We will see what her iron studies look like.  Again, I had to believe that her iron will be on the low side.  We probably will have to get her back in a lot sooner than 6 months.  I would like to see her back probably in early November before the Holiday season to make sure that her blood is okay for the holidays.    Volanda Napoleon, MD 9/29/20239:05 AM

## 2022-04-30 NOTE — Telephone Encounter (Signed)
-----   Message from Volanda Napoleon, MD sent at 04/30/2022  1:31 PM EDT ----- As expected, her iron is incredibly low.  Call her and tell her that she needs IV iron.  Please set this up.  Thanks.  Laurey Arrow

## 2022-05-07 ENCOUNTER — Ambulatory Visit (INDEPENDENT_AMBULATORY_CARE_PROVIDER_SITE_OTHER): Payer: 59 | Admitting: Adult Health

## 2022-05-07 ENCOUNTER — Encounter: Payer: Self-pay | Admitting: Adult Health

## 2022-05-07 DIAGNOSIS — F321 Major depressive disorder, single episode, moderate: Secondary | ICD-10-CM | POA: Diagnosis not present

## 2022-05-07 DIAGNOSIS — F411 Generalized anxiety disorder: Secondary | ICD-10-CM | POA: Diagnosis not present

## 2022-05-07 DIAGNOSIS — F5105 Insomnia due to other mental disorder: Secondary | ICD-10-CM | POA: Diagnosis not present

## 2022-05-07 DIAGNOSIS — F99 Mental disorder, not otherwise specified: Secondary | ICD-10-CM

## 2022-05-07 NOTE — Progress Notes (Signed)
Kim Gillespie [id number] 04/08/1971 51 y.o.  Subjective:   Patient ID:  Kim Gillespie is a 51 y.o. (DOB 1971/02/20) female.  Chief Complaint: No chief complaint on file.   HPI Kim Gillespie presents to the office today for follow-up of MDD, GAD and insomnia.  Describes mood today as "improving". Pleasant. Tearful at times. Mood symptoms - reports decreased depression,  irritability and anxiety - "still feels more depressed overall". Reports worry and rumination. Reports over thinking. Mood fluctuates between happy and sad - "more happier days". Feels like the Zoloft at 50mg  is helpful and would like to continue. Completing some tasks around the house. Mostly staying at home - trying to work on getting out more. Working with therapist regularly. Decreased interest and motivation. Taking medications as prescribed. Energy levels lower - feeling tired. Active, does not have a regular exercise routine.  Enjoys some usual interests and activities. Married. Lives with husband - 3 children. Spending time with family. No extended family local.  Appetite adequate. Weight loss 198 pounds. Sleeping difficulties. Averages 7 hours of broken sleep. Reports some daytime napping. Focus and concentration difficulties - "still having brain fog". Working with PCP for low iron levels - upcoming transfusion. Difficulties remembering things or thinking of the a word. Completing tasks. Managing some aspects of household. Has returned to work 8 hours a day - 3 days a week - would like to increase schedule to 4 days a week Denies SI or HI. Less SI - "less frequent". Denies AH or VH. Denies recent self harm. Denies substance use.  Previous medication trials:  Denies   [place] Office Visit from 08/11/2021 in [facility]  Total GAD-7 Score 1      PHQ2-9    [place] Office Visit from 08/11/2021 in [place]  PHQ-2 Total  Score 2  PHQ-9 Total Score 10        Review of Systems:  Review of Systems  Musculoskeletal:  Negative for gait problem.  Neurological:  Negative for tremors.  Psychiatric/Behavioral:         Please refer to HPI    Medications: I have reviewed the patient's current medications.  Current Outpatient Medications  Medication Sig Dispense Refill   Acyclovir (ZOVIRAX PO) Take by mouth as needed.     amLODipine (NORVASC) 5 MG tablet Take 1 tablet (5 mg total) by mouth daily. 90 tablet 0   fluticasone (FLONASE) 50 MCG/ACT nasal spray Place 2 sprays into both nostrils daily. 16 g 0   sertraline (ZOLOFT) 50 MG tablet TAKE 1 TABLET BY MOUTH EVERY DAY 90 tablet 1   No current facility-administered medications for this visit.    Medication Side Effects: None  Allergies:  Allergies  Allergen Reactions   Iodine Shortness Of Breath    Past Medical History:  Diagnosis Date   Goals of care, counseling/discussion 09/09/2021   Iron deficiency anemia 09/09/2021   Menometrorrhagia 09/09/2021    Past Medical History, Surgical history, Social history, and Family history were reviewed and updated as appropriate.   Please see review of systems for further details on the patient's review from today.   Objective:   Physical Exam:  There were no vitals taken for this visit.  Physical Exam Constitutional:      General: She is not in acute distress. Musculoskeletal:        General: No deformity.  Neurological:     Mental Status:  She is alert and oriented to person, place, and time.     Coordination: Coordination normal.  Psychiatric:        Attention and Perception: Attention and perception normal. She does not perceive auditory or visual hallucinations.        Mood and Affect: Mood normal. Mood is not anxious or depressed. Affect is not labile, blunt, angry or inappropriate.        Speech: Speech normal.        Behavior: Behavior normal.        Thought Content: Thought content normal.  Thought content is not paranoid or delusional. Thought content does not include homicidal or suicidal ideation. Thought content does not include homicidal or suicidal plan.        Cognition and Memory: Cognition and memory normal.        Judgment: Judgment normal.     Comments: Insight intact     Lab Review:     Component Value Date/Time   NA 138 04/30/2022 0829   K 3.7 04/30/2022 0829   CL 103 04/30/2022 0829   CO2 30 04/30/2022 0829   GLUCOSE 96 04/30/2022 0829   BUN 13 04/30/2022 0829   CREATININE 0.74 04/30/2022 0829   CREATININE 0.74 08/11/2021 0000   CALCIUM 9.5 04/30/2022 0829   PROT 7.3 04/30/2022 0829   ALBUMIN 4.3 04/30/2022 0829   AST 15 04/30/2022 0829   ALT 14 04/30/2022 0829   ALKPHOS 63 04/30/2022 0829   BILITOT 0.4 04/30/2022 0829   GFRNONAA >60 04/30/2022 0829   /    Component Value Date/Time   WBC 7.2 04/30/2022 0829   WBC 7.9 08/11/2021 0000   RBC 4.35 04/30/2022 0830   RBC 4.48 04/30/2022 0829   HGB 11.0 (L) 04/30/2022 0829   HCT 35.8 (L) 04/30/2022 0829   PLT 346 04/30/2022 0829   MCV 79.9 (L) 04/30/2022 0829   MCH 24.6 (L) 04/30/2022 0829   MCHC 30.7 04/30/2022 0829   RDW 14.7 04/30/2022 0829   LYMPHSABS 1.4 04/30/2022 0829   MONOABS 0.5 04/30/2022 0829   EOSABS 0.1 04/30/2022 0829   BASOSABS 0.0 04/30/2022 0829    No results found for: "POCLITH", "LITHIUM"   No results found for: "PHENYTOIN", "PHENOBARB", "VALPROATE", "CBMZ"   .res Assessment: Plan:    Plan:  Continue Zoloft 50mg  daily  Patient is willing to adjust work schedule effective 05/10/2022 to 4 - 8 hour days. An increase in work load will be assessed at next visit in 4 weeks.   Continue therapy with 07/10/2022  Time spent with patient was 25 minutes. Greater than 50% of face to face time with patient was spent on counseling and coordination of care.    RTC 4 weeks  Patient advised to contact office with any questions, adverse effects, or acute worsening in signs and  symptoms.  Diagnoses and all orders for this visit:  Generalized anxiety disorder  Depression, major, single episode, moderate (HCC)  Insomnia due to other mental disorder     Please see After Visit Summary for patient specific instructions.  Future Appointments  Date Time Provider Department Center  05/17/2022  3:00 PM 05/19/2022, LCSW [facility] None  06/14/2022 11:00 AM 06/16/2022, LCSW [facility] None  06/18/2022  8:45 AM [facility] None  06/18/2022  9:00 AM [place], 06/20/2022, MD [facility] None    No orders of the defined types were placed in this encounter.   -------------------------------

## 2022-05-14 ENCOUNTER — Telehealth: Payer: Self-pay | Admitting: Adult Health

## 2022-05-14 NOTE — Telephone Encounter (Signed)
Pt called reporting paperwork to return to work should read no restrictions. She and Barnett Applebaum dicussed at apt 10/6. Pt will change work hours instead.

## 2022-05-14 NOTE — Telephone Encounter (Signed)
Received LTD paperwork. Given to [facility] 10/13

## 2022-05-17 ENCOUNTER — Ambulatory Visit: Payer: 59 | Admitting: Psychiatry

## 2022-05-17 ENCOUNTER — Inpatient Hospital Stay: Payer: 59 | Attending: Hematology & Oncology

## 2022-05-17 ENCOUNTER — Other Ambulatory Visit: Payer: Self-pay | Admitting: Family

## 2022-05-17 ENCOUNTER — Telehealth: Payer: Self-pay | Admitting: Adult Health

## 2022-05-17 VITALS — BP 138/90 | HR 59 | Temp 98.8°F | Resp 17

## 2022-05-17 DIAGNOSIS — D508 Other iron deficiency anemias: Secondary | ICD-10-CM

## 2022-05-17 DIAGNOSIS — N921 Excessive and frequent menstruation with irregular cycle: Secondary | ICD-10-CM | POA: Diagnosis not present

## 2022-05-17 DIAGNOSIS — F411 Generalized anxiety disorder: Secondary | ICD-10-CM

## 2022-05-17 DIAGNOSIS — D500 Iron deficiency anemia secondary to blood loss (chronic): Secondary | ICD-10-CM | POA: Diagnosis present

## 2022-05-17 DIAGNOSIS — Z0289 Encounter for other administrative examinations: Secondary | ICD-10-CM

## 2022-05-17 MED ORDER — SODIUM CHLORIDE 0.9 % IV SOLN
300.0000 mg | Freq: Once | INTRAVENOUS | Status: AC
  Administered 2022-05-17: 300 mg via INTRAVENOUS
  Filled 2022-05-17: qty 300

## 2022-05-17 MED ORDER — SODIUM CHLORIDE 0.9 % IV SOLN: INTRAVENOUS | Status: DC

## 2022-05-17 NOTE — Patient Instructions (Signed)

## 2022-05-17 NOTE — Telephone Encounter (Signed)
Received Return to Work Form. Given to [facility] 10/16

## 2022-05-17 NOTE — Progress Notes (Signed)
[facility] Counselor/Therapist Progress Note  Patient ID: Kim Gillespie, MRN: [id number],    Date: 05/17/2022  Time Spent: 55 minutes   Treatment Type: Individual Therapy  Reported Symptoms: anxiety, fatigue, some brain fog  Mental Status Exam:  Appearance:   Neat and Well Groomed     Behavior:  Appropriate, Sharing, and Motivated  Motor:  Normal  Speech/Language:   Clear and Coherent  Affect:  anxious  Mood:  anxious  Thought process:  goal directed  Thought content:    WNL  Sensory/Perceptual disturbances:    WNL  Orientation:  oriented to person, place, time/date, situation, day of week, month of year, year, and stated date of Oct. 16, 2023  Attention:  Fair  Concentration:  Fair  Memory:  Some brain fog  Fund of knowledge:   Good  Insight:    Good  Judgment:   Good  Impulse Control:  Good   Risk Assessment: Danger to Self:  No Self-injurious Behavior: No Danger to Others: No Duty to Warn:no Physical Aggression / Violence:No  Access to Firearms a concern: No  Gang Involvement:No   Subjective:  "A lot more better days, some fatigue, anxiety, depression improved.  "Not as bad as it was with depression", but still some brain fog". Anxious but improving and has come down to "a 7 from an 8 or 9". To return to working 4 days in stead of 3 days soon. Sleep is better. Self-doubt is improving. Enjoying buying flowers and planting them. Frequent contact with friends. Feeling good about family contacts. Denies any SI. "Not really using the positive self-talk" and agrees to put more effort in this. Plan at work is to go to different job there that would not be as hectic and involve smaller projects. Working through "in my mind" that things will be ok. Relying more on prayer as a source of comfort and does feel she is getting stronger. More encouraged within herself. Family very supportive. Much less tearfulness.  Smiling more.  Encouraged her positive self talk more regularly  and reframing of some of the thoughts she had about making changes at work to being "better self-care" and not just a "stepdown" and patient stated that she can definitely identify with this being a provision of better self-care.  Interventions: Cognitive Behavioral Therapy and Ego-Supportive  Long-term goal: Elevate mood and show evidence of usual energy, activities, and socialization level.  Alleviation of SI. Short-term goal: Verbalize an understanding of the relationship between repressed anger and depressed mood, and work with strategies to help patient express her anger in order to experience some alleviation of depressed mood. Strategies: Replace negative self-defeating self talk with verbalization of realistic and positive cognitive messages.  Diagnosis:   ICD-10-CM   1. Generalized anxiety disorder  F41.1      Plan: Patient in today showing good motivation and participation in session as she worked more on her anxiety and depression, both of which she acknowledges are improving especially the depression.  Has made the decision to change to something at work that would be less hectic and less demanding and is feeling ready to do something different.  Less self-doubt, very little tearfulness, smiling more, seeing more of the bright side and her future and feeling that she has options that will be better for her.  (Not all details included in this note due to patient privacy needs.)  Continues working on her goals between sessions.  Less self-doubt and more self-awareness.  Enjoying family  time together. Encouraged patient in practicing more positive behaviors including: Continue work with strategies that are helpful in alleviating her anxiety and depression, enjoy family activities, focus on what she can change her control versus cannot, using deep breathing and mindfulness exercises during times of increased stress and anxiety, remain in the present versus the past or jumping to forehead into  the future, staying in touch with people who are supportive, following through on strategies discussed in sessions that can help her cope, physical exercise including walking, looking for more positives versus negatives each day, healthy nutrition and exercise, refrain from assuming worst-case scenarios, positive self talk and self care, healthy boundaries with others, reduce overthinking and overanalyzing, reflect on the progress she has been making, and recognize the strength she shows working with goal-directed behaviors to move in a direction that supports her improved emotional health and overall wellbeing.  Goal review and progress/challenges noted with patient.  Next appointment within 2 to 3 weeks.  This record has been created using [employer].  Chart creation errors have been sought, but may not always have been located and corrected.  Such creation errors do not reflect on the standard of medical care provided.   Shanon Ace, LCSW

## 2022-05-18 NOTE — Telephone Encounter (Signed)
Paper work completed given to [place] to review and sign

## 2022-05-18 NOTE — Telephone Encounter (Signed)
Paper work completed and given to Regina to review and sign ?

## 2022-05-24 ENCOUNTER — Inpatient Hospital Stay: Payer: 59

## 2022-05-24 VITALS — BP 134/94 | HR 88 | Temp 98.1°F | Resp 18

## 2022-05-24 DIAGNOSIS — D508 Other iron deficiency anemias: Secondary | ICD-10-CM

## 2022-05-24 DIAGNOSIS — D500 Iron deficiency anemia secondary to blood loss (chronic): Secondary | ICD-10-CM | POA: Diagnosis not present

## 2022-05-24 MED ORDER — SODIUM CHLORIDE 0.9 % IV SOLN
300.0000 mg | Freq: Once | INTRAVENOUS | Status: AC
  Administered 2022-05-24: 300 mg via INTRAVENOUS
  Filled 2022-05-24: qty 300

## 2022-05-24 MED ORDER — SODIUM CHLORIDE 0.9 % IV SOLN: INTRAVENOUS | Status: DC

## 2022-05-24 NOTE — Progress Notes (Signed)
Pt declined to stay for post infusion observation period. Pt stated she has tolerated medication multiple times prior without difficulty. Pt aware to call clinic with any questions or concerns. Pt verbalized understanding and had no further questions.  ? ?

## 2022-05-24 NOTE — Patient Instructions (Signed)

## 2022-05-31 ENCOUNTER — Inpatient Hospital Stay: Payer: 59

## 2022-05-31 VITALS — BP 129/79 | HR 60 | Temp 98.8°F | Resp 17

## 2022-05-31 DIAGNOSIS — Z95828 Presence of other vascular implants and grafts: Secondary | ICD-10-CM

## 2022-05-31 DIAGNOSIS — D500 Iron deficiency anemia secondary to blood loss (chronic): Secondary | ICD-10-CM | POA: Diagnosis not present

## 2022-05-31 DIAGNOSIS — D508 Other iron deficiency anemias: Secondary | ICD-10-CM

## 2022-05-31 MED ORDER — SODIUM CHLORIDE 0.9 % IV SOLN
300.0000 mg | Freq: Once | INTRAVENOUS | Status: AC
  Administered 2022-05-31: 300 mg via INTRAVENOUS
  Filled 2022-05-31: qty 300

## 2022-05-31 MED ORDER — ACETAMINOPHEN 325 MG PO TABS
650.0000 mg | ORAL_TABLET | Freq: Once | ORAL | Status: AC
  Administered 2022-05-31: 650 mg via ORAL
  Filled 2022-05-31: qty 2

## 2022-05-31 MED ORDER — SODIUM CHLORIDE 0.9 % IV SOLN: INTRAVENOUS | Status: DC

## 2022-05-31 NOTE — Patient Instructions (Signed)

## 2022-06-04 ENCOUNTER — Ambulatory Visit: Payer: 59 | Admitting: Adult Health

## 2022-06-04 NOTE — Progress Notes (Signed)
Patient r/s - power outage.  

## 2022-06-14 ENCOUNTER — Encounter: Payer: Self-pay | Admitting: Psychiatry

## 2022-06-14 ENCOUNTER — Ambulatory Visit: Payer: 59 | Admitting: Psychiatry

## 2022-06-14 DIAGNOSIS — F411 Generalized anxiety disorder: Secondary | ICD-10-CM | POA: Diagnosis not present

## 2022-06-14 NOTE — Progress Notes (Signed)
[facility] Counselor/Therapist Progress Note  Patient ID: Kim Gillespie, MRN: [id number],    Date: 06/14/2022  Time Spent: 50 minutes   Treatment Type: Individual Therapy  Reported Symptoms: anxiety "main symptom",  depression improved  Mental Status Exam:  Appearance:   Casual and Neat     Behavior:  Appropriate, Sharing, and Motivated  Motor:  Normal  Speech/Language:   Clear and Coherent  Affect:  anxious  Mood:  anxious  Thought process:  goal directed  Thought content:    WNL  Sensory/Perceptual disturbances:    WNL  Orientation:  oriented to person, place, time/date, situation, day of week, month of year, year, and stated date of June 14, 2022  Attention:  Good  Concentration:  Fair  Memory:  [place] of knowledge:   Good  Insight:    Good  Judgment:   Good  Impulse Control:  Good   Risk Assessment: Danger to Self:  No Self-injurious Behavior: No Danger to Others: No Duty to Warn:no Physical Aggression / Violence:No  Access to Firearms a concern: No  Gang Involvement:No   Subjective:  Patient in today and reporting "I feel I'm doing much better, no sadness, not depressed." Had good weekend. Back at work 4 days weekly. Does plan to check at current job location for different job and also outside opportunities. Gets tired at times after being busy at work or just with other people. Anxiety and depression are both improved. Trying to set better limits at home and take better care of herself overall. Together more with friends/family. "Some anxiety over normal stressors like laptop not working right today." "Still a little bit of brain fog". Anxiety has reduced to a " 5" from a 7 recently, trying not to make assumptions that are negative. Encouraging her to have more "self-care time and moments", self-doubt is still improving. Some improved self-confidence. Denies any SI. Enjoying planting flowers. Doesn't feel ready to stop therapy yet as "I still have goals to  work [employer] on her faith, prayer, family, and friends as she gets stronger. Feeling more encouraged. Family supportive. Is now working 4 days per week vs 5 days  Interventions: Cognitive Behavioral Therapy and Ego-Supportive  Long-term goal:. Elevate mood and show evidence of usual energy, activities, and socialization level.  Alleviation of SI. Short-term goal: Verbalize an understanding of the relationship between repressed anger and depressed mood, and work with strategies to help patient express her anger in order to experience some alleviation of depressed mood. Strategies: Replace negative self-defeating self talk with verbalization of realistic and positive cognitive messages.  Diagnosis:   ICD-10-CM   1. Generalized anxiety disorder  F41.1      Plan:  Patient today actively participating in session, reviewing progress and also remaining need areas that she is continuing to work on in order to move forward. To allow for better sleep pattern. No tearfulness today. Smiling more.  Feeling more "settled" and better able to make decisions about work and work/life balance, which we discussed more in session today.  Has made noticeable progress and continues to need therapy as she works further on goal-directed behaviors in order to work towards meeting her remaining goals and feel increasingly stable and positive about herself.  Has definitely shown a lot of effort and continues to do so. Encouraged patient in her practice of more positive behaviors as noted in session including: To continue work with strategies that are helpful for her in alleviating anxiety and  depression, participate in family activities, focus on what she can change or control versus cannot, using deep breathing and mindfulness exercises during times of increased stress and anxiety, stay in the present versus the past or jumping into the future, staying in touch with people who are supportive, following through on  strategies discussed in sessions that can help her cope, physical exercise including walking, looking for more positives versus negatives daily, healthy nutrition and exercise, refrain from assuming worst-case scenarios, positive self talk and self-care, healthy boundaries, reduce overthinking and overanalyzing, and realize the strength she shows working with goal-directed behaviors to move in a direction that supports her improved emotional health.  Goal review and progress/challenges noted with patient.  Next appointment within 2 to 3 weeks.  This record has been created using [employer].  Chart creation errors have been sought, but may not always have been located and corrected.  Such creation errors do not reflect on the standard of medical care provided.   Mathis Fare, LCSW

## 2022-06-18 ENCOUNTER — Encounter: Payer: Self-pay | Admitting: *Deleted

## 2022-06-18 ENCOUNTER — Inpatient Hospital Stay: Payer: 59 | Admitting: Hematology & Oncology

## 2022-06-18 ENCOUNTER — Inpatient Hospital Stay: Payer: 59 | Attending: Hematology & Oncology

## 2022-06-18 VITALS — BP 129/88 | HR 64 | Temp 98.1°F | Resp 17 | Ht 65.5 in | Wt 193.1 lb

## 2022-06-18 DIAGNOSIS — D500 Iron deficiency anemia secondary to blood loss (chronic): Secondary | ICD-10-CM

## 2022-06-18 DIAGNOSIS — N921 Excessive and frequent menstruation with irregular cycle: Secondary | ICD-10-CM | POA: Diagnosis not present

## 2022-06-18 LAB — CMP (CANCER CENTER ONLY)
ALT: 15 U/L (ref 0–44)
AST: 13 U/L — ABNORMAL LOW (ref 15–41)
Albumin: 4.3 g/dL (ref 3.5–5.0)
Alkaline Phosphatase: 66 U/L (ref 38–126)
Anion gap: 8 (ref 5–15)
BUN: 16 mg/dL (ref 6–20)
CO2: 30 mmol/L (ref 22–32)
Calcium: 9.4 mg/dL (ref 8.9–10.3)
Chloride: 102 mmol/L (ref 98–111)
Creatinine: 0.79 mg/dL (ref 0.44–1.00)
GFR, Estimated: >60 mL/min (ref >60)
Glucose, Bld: 119 mg/dL — ABNORMAL HIGH (ref 70–99)
Potassium: 3.6 mmol/L (ref 3.5–5.1)
Sodium: 140 mmol/L (ref 135–145)
Total Bilirubin: 0.4 mg/dL (ref 0.3–1.2)
Total Protein: 7.1 g/dL (ref 6.5–8.1)

## 2022-06-18 LAB — CBC WITH DIFFERENTIAL (CANCER CENTER ONLY)
Abs Immature Granulocytes: 0.02 10*3/uL (ref 0.00–0.07)
Basophils Absolute: 0 10*3/uL (ref 0.0–0.1)
Basophils Relative: 0 %
Eosinophils Absolute: 0.1 10*3/uL (ref 0.0–0.5)
Eosinophils Relative: 1 %
HCT: 41.1 % (ref 36.0–46.0)
Hemoglobin: 12.6 g/dL (ref 12.0–15.0)
Immature Granulocytes: 0 %
Lymphocytes Relative: 19 %
Lymphs Abs: 1.3 10*3/uL (ref 0.7–4.0)
MCH: 26.5 pg (ref 26.0–34.0)
MCHC: 30.7 g/dL (ref 30.0–36.0)
MCV: 86.3 fL (ref 80.0–100.0)
Monocytes Absolute: 0.3 10*3/uL (ref 0.1–1.0)
Monocytes Relative: 5 %
Neutro Abs: 5.1 10*3/uL (ref 1.7–7.7)
Neutrophils Relative %: 75 %
Platelet Count: 311 10*3/uL (ref 150–400)
RBC: 4.76 MIL/uL (ref 3.87–5.11)
RDW: 22.5 % — ABNORMAL HIGH (ref 11.5–15.5)
WBC Count: 6.9 10*3/uL (ref 4.0–10.5)
nRBC: 0 % (ref 0.0–0.2)

## 2022-06-18 LAB — RETICULOCYTES
Immature Retic Fract: 10.9 % (ref 2.3–15.9)
RBC.: 4.73 MIL/uL (ref 3.87–5.11)
Retic Count, Absolute: 79.9 10*3/uL (ref 19.0–186.0)
Retic Ct Pct: 1.7 % (ref 0.4–3.1)

## 2022-06-18 LAB — IRON AND IRON BINDING CAPACITY (CC-WL,HP ONLY)
Iron: 76 ug/dL (ref 28–170)
Saturation Ratios: 20 % (ref 10.4–31.8)
TIBC: 386 ug/dL (ref 250–450)
UIBC: 310 ug/dL (ref 148–442)

## 2022-06-18 LAB — FERRITIN: Ferritin: 75 ng/mL (ref 11–307)

## 2022-06-18 LAB — VITAMIN B12: Vitamin B-12: 385 pg/mL (ref 180–914)

## 2022-06-18 NOTE — Progress Notes (Signed)
Hematology and Oncology Follow Up Visit  Mahlia Fernando [id number] 07/23/1971 51 y.o. 06/18/2022   Principle Diagnosis:  Iron deficiency anemia secondary to menometrorrhagia  Current Therapy:   IV iron-Venofer given on 05/31/2022     Interim History:  Ms. Brahmbhatt is back for follow-up.  We had to give her iron loss of that we saw her.  More last saw her back in September, her ferritin was only 4 with an iron saturation of 4%.  She got Venofer.  She feels better.  She still feels little bit tired..  She still has her monthly cycles.  Hopefully, these will be able to slow down.  I am sure this is why she is losing iron.  She is good have a nice Thanksgiving.  It sounds like family will be coming in.  She has had no change in bowel or bladder habits.  There has been no issues with nausea or vomiting.  She has had no rashes.  There has been no cough or shortness of breath.  I told her to keep taking folic acid.  I think this is very important for her.  I also told her to take vitamin B12.  Overall, I would say that her performance status is probably ECOG 1.    Medications:  Current Outpatient Medications:    Acyclovir (ZOVIRAX PO), Take by mouth as needed., Disp: , Rfl:    amLODipine (NORVASC) 5 MG tablet, Take 1 tablet (5 mg total) by mouth daily., Disp: 90 tablet, Rfl: 0   fluticasone (FLONASE) 50 MCG/ACT nasal spray, Place 2 sprays into both nostrils daily., Disp: 16 g, Rfl: 0   sertraline (ZOLOFT) 50 MG tablet, TAKE 1 TABLET BY MOUTH EVERY DAY, Disp: 90 tablet, Rfl: 1  Allergies:  Allergies  Allergen Reactions   Iodine Shortness Of Breath   Iodinated Contrast Media     Coughing spasms, with nausea, and itching post contrast  Coughing spasms, with nausea, and itching post contrast     Past Medical History, Surgical history, Social history, and Family History were reviewed and updated.  Review of Systems: Review of Systems  Constitutional: Negative.   HENT:  Negative.     Eyes: Negative.   Respiratory: Negative.    Cardiovascular: Negative.   Gastrointestinal: Negative.   Endocrine: Negative.   Genitourinary: Negative.    Musculoskeletal: Negative.   Skin: Negative.   Neurological: Negative.   Hematological: Negative.   Psychiatric/Behavioral: Negative.      Physical Exam:  height is 5' 5.5" (1.664 m) and weight is 193 lb 1.9 oz (87.6 kg). Her oral temperature is 98.1 F (36.7 C). Her blood pressure is 129/88 and her pulse is 64. Her respiration is 17 and oxygen saturation is 100%.   Wt Readings from Last 3 Encounters:  06/18/22 193 lb 1.9 oz (87.6 kg)  04/30/22 194 lb 1.3 oz (88 kg)  10/29/21 203 lb 12.8 oz (92.4 kg)    Physical Exam Vitals reviewed.  HENT:     Head: Normocephalic and atraumatic.  Eyes:     Pupils: Pupils are equal, round, and reactive to light.  Cardiovascular:     Rate and Rhythm: Normal rate and regular rhythm.     Heart sounds: Normal heart sounds.  Pulmonary:     Effort: Pulmonary effort is normal.     Breath sounds: Normal breath sounds.  Abdominal:     General: Bowel sounds are normal.     Palpations: Abdomen is soft.  Musculoskeletal:  General: No tenderness or deformity. Normal range of motion.     Cervical back: Normal range of motion.  Lymphadenopathy:     Cervical: No cervical adenopathy.  Skin:    General: Skin is warm and dry.     Findings: No erythema or rash.  Neurological:     Mental Status: She is alert and oriented to person, place, and time.  Psychiatric:        Behavior: Behavior normal.        Thought Content: Thought content normal.        Judgment: Judgment normal.     Lab Results  Component Value Date   WBC 6.9 06/18/2022   HGB 12.6 06/18/2022   HCT 41.1 06/18/2022   MCV 86.3 06/18/2022   PLT 311 06/18/2022     Chemistry      Component Value Date/Time   NA 140 06/18/2022 0855   K 3.6 06/18/2022 0855   CL 102 06/18/2022 0855   CO2 30 06/18/2022 0855   BUN 16  06/18/2022 0855   CREATININE 0.79 06/18/2022 0855   CREATININE 0.74 08/11/2021 0000      Component Value Date/Time   CALCIUM 9.4 06/18/2022 0855   ALKPHOS 66 06/18/2022 0855   AST 13 (L) 06/18/2022 0855   ALT 15 06/18/2022 0855   BILITOT 0.4 06/18/2022 0855       Impression and Plan: Ms. Mohan is a very nice 51 year old Afro-American female.  She had menometrorrhagia.  I think this is the reason for her iron deficiency.  Her MCV is much better.  As such, I have to believe that her iron level should be better.  As long as she has her monthly cycle, she is going become iron deficient.  Again, taking folic acid along with vitamin B12 and vitamin C I think will help her out.  We will plan to get her back in about 2 months or so.  I still think we have to follow her closely.    Volanda Napoleon, MD 11/17/20239:47 AM

## 2022-07-09 ENCOUNTER — Ambulatory Visit: Payer: 59 | Admitting: Psychiatry

## 2022-07-16 ENCOUNTER — Ambulatory Visit: Payer: 59 | Admitting: Adult Health

## 2022-07-21 ENCOUNTER — Ambulatory Visit (INDEPENDENT_AMBULATORY_CARE_PROVIDER_SITE_OTHER): Payer: 59 | Admitting: Adult Health

## 2022-07-21 ENCOUNTER — Encounter: Payer: Self-pay | Admitting: Adult Health

## 2022-07-21 DIAGNOSIS — F99 Mental disorder, not otherwise specified: Secondary | ICD-10-CM

## 2022-07-21 DIAGNOSIS — F321 Major depressive disorder, single episode, moderate: Secondary | ICD-10-CM

## 2022-07-21 DIAGNOSIS — F5105 Insomnia due to other mental disorder: Secondary | ICD-10-CM

## 2022-07-21 DIAGNOSIS — F411 Generalized anxiety disorder: Secondary | ICD-10-CM | POA: Diagnosis not present

## 2022-07-21 MED ORDER — SERTRALINE HCL 50 MG PO TABS: 50.0000 mg | ORAL_TABLET | Freq: Every day | ORAL | 1 refills | Status: DC

## 2022-07-21 NOTE — Progress Notes (Signed)
[name] [id number] 20-Mar-1971 51 y.o.  Subjective:   Patient ID:  Kim Gillespie is a 51 y.o. (DOB [birth year]) female.  Chief Complaint: No chief complaint on file.   HPI Kim Gillespie presents to the office today for follow-up of MDD, GAD and insomnia.  Describes mood today as "improving". Pleasant. Tearful at times. Mood symptoms - reports decreased depression, irritability and anxiety. Reports decreased worry and rumination. Reports decreased over thinking. Mood is more consistent.Stating "I'm just taking one day at a time and be mindful". Feels like the Zoloft at 50mg  is helpful. Working with therapist regularly. Increased interest and motivation. Taking medications as prescribed. Energy levels improved. Active, does not have a regular exercise routine.  Enjoys some usual interests and activities. Married. Lives with husband - 3 children. Spending time with family. No extended family local.  Appetite adequate. Weight loss 198 pounds. Sleep has improved. Averages 7 hours. Focus and concentration improved - "still having brain fog". Difficulties remembering things or thinking of  a word. Working with PCP for low iron levels - infusion. Completing tasks. Managing some aspects of household. Has returned to work 8 hours a day - 4 days a week. Denies SI or HI. Denies AH or VH. Denies recent self harm. Denies substance use.  Previous medication trials: Denies   GAD-7    Flowsheet Row Office Visit from 08/11/2021 in [facility]  Total GAD-7 Score 1      PHQ2-9    Flowsheet Row Office Visit from 08/11/2021 in [facility]  PHQ-2 Total Score 2  PHQ-9 Total Score 10        Review of Systems:  Review of Systems  Musculoskeletal:  Negative for gait problem.  Neurological:  Negative for tremors.  Psychiatric/Behavioral:         Please refer to HPI    Medications: I have reviewed the patient's current  medications.  Current Outpatient Medications  Medication Sig Dispense Refill   Acyclovir (ZOVIRAX PO) Take by mouth as needed.     amLODipine (NORVASC) 5 MG tablet Take 1 tablet (5 mg total) by mouth daily. 90 tablet 0   fluticasone (FLONASE) 50 MCG/ACT nasal spray Place 2 sprays into both nostrils daily. 16 g 0   sertraline (ZOLOFT) 50 MG tablet Take 1 tablet (50 mg total) by mouth daily. 90 tablet 1   No current facility-administered medications for this visit.    Medication Side Effects: None  Allergies:  Allergies  Allergen Reactions   Iodine Shortness Of Breath   Iodinated Contrast Media     Coughing spasms, with nausea, and itching post contrast  Coughing spasms, with nausea, and itching post contrast     Past Medical History:  Diagnosis Date   Goals of care, counseling/discussion 09/09/2021   Iron deficiency anemia 09/09/2021   Menometrorrhagia 09/09/2021    Past Medical History, Surgical history, Social history, and Family history were reviewed and updated as appropriate.   Please see review of systems for further details on the patient's review from today.   Objective:   Physical Exam:  There were no vitals taken for this visit.  Physical Exam Constitutional:      General: She is not in acute distress. Musculoskeletal:        General: No deformity.  Neurological:     Mental Status: She is alert and oriented to person, place, and time.     Coordination: Coordination normal.  Psychiatric:  Attention and Perception: Attention and perception normal. She does not perceive auditory or visual hallucinations.        Mood and Affect: Mood normal. Mood is not anxious or depressed. Affect is not labile, blunt, angry or inappropriate.        Speech: Speech normal.        Behavior: Behavior normal.        Thought Content: Thought content normal. Thought content is not paranoid or delusional. Thought content does not include homicidal or suicidal ideation. Thought  content does not include homicidal or suicidal plan.        Cognition and Memory: Cognition and memory normal.        Judgment: Judgment normal.     Comments: Insight intact     Lab Review:     Component Value Date/Time   NA 140 06/18/2022 0855   K 3.6 06/18/2022 0855   CL 102 06/18/2022 0855   CO2 30 06/18/2022 0855   GLUCOSE 119 (H) 06/18/2022 0855   BUN 16 06/18/2022 0855   CREATININE 0.79 06/18/2022 0855   CREATININE 0.74 08/11/2021 0000   CALCIUM 9.4 06/18/2022 0855   PROT 7.1 06/18/2022 0855   ALBUMIN 4.3 06/18/2022 0855   AST 13 (L) 06/18/2022 0855   ALT 15 06/18/2022 0855   ALKPHOS 66 06/18/2022 0855   BILITOT 0.4 06/18/2022 0855   GFRNONAA >60 06/18/2022 0855       Component Value Date/Time   WBC 6.9 06/18/2022 0855   WBC 7.9 08/11/2021 0000   RBC 4.76 06/18/2022 0855   RBC 4.73 06/18/2022 0855   HGB 12.6 06/18/2022 0855   HCT 41.1 06/18/2022 0855   PLT 311 06/18/2022 0855   MCV 86.3 06/18/2022 0855   MCH 26.5 06/18/2022 0855   MCHC 30.7 06/18/2022 0855   RDW 22.5 (H) 06/18/2022 0855   LYMPHSABS 1.3 06/18/2022 0855   MONOABS 0.3 06/18/2022 0855   EOSABS 0.1 06/18/2022 0855   BASOSABS 0.0 06/18/2022 0855    No results found for: "POCLITH", "LITHIUM"   No results found for: "PHENYTOIN", "PHENOBARB", "VALPROATE", "CBMZ"   .res Assessment: Plan:    Plan:  Continue Zoloft 50mg  daily  Continue therapy with  Time spent with patient was 20 minutes. Greater than 50% of face to face time with patient was spent on counseling and coordination of care.    RTC 8 weeks  Patient advised to contact office with any questions, adverse effects, or acute worsening in signs and symptoms.  Diagnoses and all orders for this visit:  Generalized anxiety disorder -     sertraline (ZOLOFT) 50 MG tablet; Take 1 tablet (50 mg total) by mouth daily.  Depression, major, single episode, moderate (HCC) -     sertraline (ZOLOFT) 50 MG tablet; Take 1 tablet  (50 mg total) by mouth daily.  Insomnia due to other mental disorder -     sertraline (ZOLOFT) 50 MG tablet; Take 1 tablet (50 mg total) by mouth daily.     Please see After Visit Summary for patient specific instructions.  Future Appointments  Date Time Provider Department Center  08/13/2022 10:00 AM 10/12/2022, LCSW [facility] None  08/25/2022 12:15 PM [facility] None  08/25/2022 12:30 PM [place], 08/27/2022, MD [facility] None    No orders of the defined types were placed in this encounter.   -------------------------------

## 2022-07-28 ENCOUNTER — Other Ambulatory Visit: Payer: Self-pay | Admitting: *Deleted

## 2022-07-28 ENCOUNTER — Encounter: Payer: Self-pay | Admitting: Hematology & Oncology

## 2022-07-28 DIAGNOSIS — D500 Iron deficiency anemia secondary to blood loss (chronic): Secondary | ICD-10-CM

## 2022-07-28 DIAGNOSIS — N921 Excessive and frequent menstruation with irregular cycle: Secondary | ICD-10-CM

## 2022-07-28 DIAGNOSIS — D509 Iron deficiency anemia, unspecified: Secondary | ICD-10-CM

## 2022-07-28 MED ORDER — FOLIC ACID 1 MG PO TABS: 1.0000 mg | ORAL_TABLET | Freq: Every day | ORAL | 3 refills | Status: AC

## 2022-08-13 ENCOUNTER — Ambulatory Visit: Payer: 59 | Admitting: Psychiatry

## 2022-08-13 DIAGNOSIS — F411 Generalized anxiety disorder: Secondary | ICD-10-CM

## 2022-08-13 NOTE — Progress Notes (Signed)
[facility] Counselor/Therapist Progress Note  Patient ID: Kim Gillespie, MRN: [id number],    Date: 08/13/2022  Time Spent: 55 minutes   Treatment Type: Individual Therapy  Reported Symptoms: anxiety (improving), depression (improving)          Mental Status Exam:  Appearance:   Casual and Neat     Behavior:  Appropriate, Sharing, and Motivated  Motor:  Normal  Speech/Language:   Clear and Coherent  Affect:  Anxious, some depression but improving  Mood:  anxious and depression improving  Thought process:  goal directed  Thought content:    Obsessive thought much better  Sensory/Perceptual disturbances:    WNL  Orientation:  oriented to person, place, time/date, situation, day of week, month of year, year, and stated date of Jan. 12, 2024  Attention:  Good  Concentration:  Good  Memory:  WNL  Fund of knowledge:   Good  Insight:    Good  Judgment:   Good  Impulse Control:  Good   Risk Assessment: Danger to Self:  No Self-injurious Behavior: No Danger to Others: No Duty to Warn:no Physical Aggression / Violence:No  Access to Firearms a concern: No  Gang Involvement:No   Subjective:  Patient in today and reports some decrease in depression and anxiety. Some recent short-term sadness.  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 Involved more with other people and getting out some more especially with family. Out of town family members have visited her. Spent more time with her kids while they were home from school. "I am doing some better and keeping my eyes out on what a trigger might be such as when [place] and when I need to over-perform at work."  Some increased energy. Started doing Yoga at [facility] Focusing on better self-care which is helping her emotionally also. Taking some Vitamins and that's helping. Planted flower bulbs. Self-doubt improving. No SI. Self-confidence improving.   Interventions: Cognitive Behavioral Therapy and  Ego-Supportive       Long-term goal:. Elevate mood and show evidence of usual energy, activities, and socialization level.  Alleviation of SI. Short-term goal: Verbalize an understanding of the relationship between repressed anger and depressed mood, and work with strategies to help patient express her anger in order to experience some alleviation of depressed mood. Strategies: Replace negative self-defeating self talk with verbalization of realistic and positive cognitive messages.  Diagnosis:   ICD-10-CM   1. Generalized anxiety disorder  F41.1      Plan:  Patient in appointment today and as noted above, is feeling less anxious and less depressed. Noticing significant improvements more recently. Shares that she still relies on her family, friends, faith, prayer.  Senses her increased strength and more encouraged.  Sleep has been better except some during holidays and right afterwards. Grateful for her family closeness and support. Lots happening within family that can be stressful and patient working to handle her anxiety better. No tearfulness since last session. Better work/life balance and feeling more "settled". Has learned not to let things build up  and address them sooner.  Recognizing definite progress but does not feel ready to stop therapy just yet.  We reviewed progress and decided to schedule her back in 3 months to see how she is doing at that point and she felt comfortable with this.  In the meantime is to continue her goal-directed behaviors and building on her progress.Encouraged patient in using positive behaviors as discussed  in session including: Working on strategies that are helpful for her in alleviating anxiety and depression, participating in family activities, focus on what she can change her control, deep breathing exercises and mindfulness during times of increased stress and anxiety, staying in touch with people who are supportive, following through on strategies discussed  in session that can help her cope, physical exercise including walking, looking for more positives versus negatives daily, healthy nutrition and exercise, refrain from assuming worst-case scenarios, positive self talk and self-care, healthy boundaries, reduce overthinking and over analyzing, and recognize the strength she shows working with goal-directed behaviors to move in a direction that supports her improved emotional health and outlook.  Goal review and progress/challenges noted with patient.  Next appointment within 3 weeks.  This record has been created using [employer].  Chart creation errors have been sought, but may not always have been located and corrected.  Such creation errors do not reflect on the standard of medical care provided.   Shanon Ace, LCSW

## 2022-08-25 ENCOUNTER — Inpatient Hospital Stay (HOSPITAL_BASED_OUTPATIENT_CLINIC_OR_DEPARTMENT_OTHER): Payer: 59 | Admitting: Hematology & Oncology

## 2022-08-25 ENCOUNTER — Other Ambulatory Visit: Payer: Self-pay

## 2022-08-25 ENCOUNTER — Encounter: Payer: Self-pay | Admitting: Hematology & Oncology

## 2022-08-25 ENCOUNTER — Inpatient Hospital Stay: Payer: 59 | Attending: Hematology & Oncology

## 2022-08-25 VITALS — BP 130/86 | HR 58 | Temp 99.5°F | Resp 16 | Ht 65.5 in | Wt 194.0 lb

## 2022-08-25 DIAGNOSIS — Z79899 Other long term (current) drug therapy: Secondary | ICD-10-CM | POA: Diagnosis not present

## 2022-08-25 DIAGNOSIS — N921 Excessive and frequent menstruation with irregular cycle: Secondary | ICD-10-CM | POA: Diagnosis not present

## 2022-08-25 DIAGNOSIS — D500 Iron deficiency anemia secondary to blood loss (chronic): Secondary | ICD-10-CM

## 2022-08-25 LAB — CBC WITH DIFFERENTIAL (CANCER CENTER ONLY)
Abs Immature Granulocytes: 0.02 10*3/uL (ref 0.00–0.07)
Basophils Absolute: 0 10*3/uL (ref 0.0–0.1)
Basophils Relative: 0 %
Eosinophils Absolute: 0.1 10*3/uL (ref 0.0–0.5)
Eosinophils Relative: 1 %
HCT: 42.5 % (ref 36.0–46.0)
Hemoglobin: 13.5 g/dL (ref 12.0–15.0)
Immature Granulocytes: 0 %
Lymphocytes Relative: 17 %
Lymphs Abs: 1.3 10*3/uL (ref 0.7–4.0)
MCH: 28.7 pg (ref 26.0–34.0)
MCHC: 31.8 g/dL (ref 30.0–36.0)
MCV: 90.2 fL (ref 80.0–100.0)
Monocytes Absolute: 0.4 10*3/uL (ref 0.1–1.0)
Monocytes Relative: 6 %
Neutro Abs: 5.7 10*3/uL (ref 1.7–7.7)
Neutrophils Relative %: 76 %
Platelet Count: 268 10*3/uL (ref 150–400)
RBC: 4.71 MIL/uL (ref 3.87–5.11)
RDW: 14.2 % (ref 11.5–15.5)
WBC Count: 7.5 10*3/uL (ref 4.0–10.5)
nRBC: 0 % (ref 0.0–0.2)

## 2022-08-25 LAB — CMP (CANCER CENTER ONLY)
ALT: 14 U/L (ref 0–44)
AST: 12 U/L — ABNORMAL LOW (ref 15–41)
Albumin: 4.5 g/dL (ref 3.5–5.0)
Alkaline Phosphatase: 64 U/L (ref 38–126)
Anion gap: 8 (ref 5–15)
BUN: 19 mg/dL (ref 6–20)
CO2: 31 mmol/L (ref 22–32)
Calcium: 9.7 mg/dL (ref 8.9–10.3)
Chloride: 103 mmol/L (ref 98–111)
Creatinine: 0.84 mg/dL (ref 0.44–1.00)
GFR, Estimated: >60 mL/min (ref >60)
Glucose, Bld: 100 mg/dL — ABNORMAL HIGH (ref 70–99)
Potassium: 4.1 mmol/L (ref 3.5–5.1)
Sodium: 142 mmol/L (ref 135–145)
Total Bilirubin: 0.3 mg/dL (ref 0.3–1.2)
Total Protein: 7.4 g/dL (ref 6.5–8.1)

## 2022-08-25 LAB — RETICULOCYTES
Immature Retic Fract: 6.8 % (ref 2.3–15.9)
RBC.: 4.66 MIL/uL (ref 3.87–5.11)
Retic Count, Absolute: 48.5 10*3/uL (ref 19.0–186.0)
Retic Ct Pct: 1 % (ref 0.4–3.1)

## 2022-08-25 LAB — FERRITIN: Ferritin: 15 ng/mL (ref 11–307)

## 2022-08-25 NOTE — Progress Notes (Signed)
Hematology and Oncology Follow Up Visit  Kim Gillespie [id number] [birth year] 52 y.o. 08/25/2022   Principle Diagnosis:  Iron deficiency anemia secondary to menometrorrhagia  Current Therapy:   IV iron-Venofer given on 05/31/2022     Interim History:  Kim Gillespie is back for follow-up.  She is doing quite well.  She had no problems over the Holiday season.  She is still has her monthly cycles although they do not seem to be as often.  We last gave her IV iron back in October.  When we last saw her back in November, her iron saturation was 20%.  Her ferritin was 75.  She has had no problems with cough or shortness of breath.  She is still taking folic acid, vitamin H20, and vitamin C.  I do not see a problem with her taking these.  She has had no change in bowel or bladder habits.  There is been no issues with rashes.  She has had no leg swelling.  She has had no issues with COVID or Influenza.  Currently, I would say that her performance status is probably ECOG 0.    Medications:  Current Outpatient Medications:    Acyclovir (ZOVIRAX PO), Take by mouth as needed., Disp: , Rfl:    amLODipine (NORVASC) 5 MG tablet, Take 1 tablet (5 mg total) by mouth daily., Disp: 90 tablet, Rfl: 0   ascorbic acid (VITAMIN C) 100 MG tablet, Take 100 mg by mouth daily., Disp: , Rfl:    cyanocobalamin 50 MCG tablet, Take 50 mcg by mouth daily., Disp: , Rfl:    fluticasone (FLONASE) 50 MCG/ACT nasal spray, Place 2 sprays into both nostrils daily., Disp: 16 g, Rfl: 0   folic acid (FOLVITE) 1 MG tablet, Take 1 tablet (1 mg total) by mouth daily., Disp: 30 tablet, Rfl: 3   sertraline (ZOLOFT) 50 MG tablet, Take 1 tablet (50 mg total) by mouth daily., Disp: 90 tablet, Rfl: 1  Allergies:  Allergies  Allergen Reactions   Iodine Shortness Of Breath   Iodinated Contrast Media     Coughing spasms, with nausea, and itching post contrast  Coughing spasms, with nausea, and itching post contrast     Past  Medical History, Surgical history, Social history, and Family History were reviewed and updated.  Review of Systems: Review of Systems  Constitutional: Negative.   HENT:  Negative.    Eyes: Negative.   Respiratory: Negative.    Cardiovascular: Negative.   Gastrointestinal: Negative.   Endocrine: Negative.   Genitourinary: Negative.    Musculoskeletal: Negative.   Skin: Negative.   Neurological: Negative.   Hematological: Negative.   Psychiatric/Behavioral: Negative.      Physical Exam:  height is 5' 5.5" (1.664 m) and weight is 194 lb (88 kg). Her oral temperature is 99.5 F (37.5 C). Her blood pressure is 130/86 and her pulse is 58 (abnormal). Her respiration is 16 and oxygen saturation is 100%.   Wt Readings from Last 3 Encounters:  08/25/22 194 lb (88 kg)  06/18/22 193 lb 1.9 oz (87.6 kg)  04/30/22 194 lb 1.3 oz (88 kg)    Physical Exam Vitals reviewed.  HENT:     Head: Normocephalic and atraumatic.  Eyes:     Pupils: Pupils are equal, round, and reactive to light.  Cardiovascular:     Rate and Rhythm: Normal rate and regular rhythm.     Heart sounds: Normal heart sounds.  Pulmonary:     Effort: Pulmonary effort is normal.  Breath sounds: Normal breath sounds.  Abdominal:     General: Bowel sounds are normal.     Palpations: Abdomen is soft.  Musculoskeletal:        General: No tenderness or deformity. Normal range of motion.     Cervical back: Normal range of motion.  Lymphadenopathy:     Cervical: No cervical adenopathy.  Skin:    General: Skin is warm and dry.     Findings: No erythema or rash.  Neurological:     Mental Status: She is alert and oriented to person, place, and time.  Psychiatric:        Behavior: Behavior normal.        Thought Content: Thought content normal.        Judgment: Judgment normal.     Lab Results  Component Value Date   WBC 7.5 08/25/2022   HGB 13.5 08/25/2022   HCT 42.5 08/25/2022   MCV 90.2 08/25/2022   PLT 268  08/25/2022     Chemistry      Component Value Date/Time   NA 142 08/25/2022 1212   K 4.1 08/25/2022 1212   CL 103 08/25/2022 1212   CO2 31 08/25/2022 1212   BUN 19 08/25/2022 1212   CREATININE 0.84 08/25/2022 1212   CREATININE 0.74 08/11/2021 0000      Component Value Date/Time   CALCIUM 9.7 08/25/2022 1212   ALKPHOS 64 08/25/2022 1212   AST 12 (L) 08/25/2022 1212   ALT 14 08/25/2022 1212   BILITOT 0.3 08/25/2022 1212       Impression and Plan: Kim Gillespie is a very nice 52 year old Afro-American female.  She had menometrorrhagia.  I think this is the reason for her iron deficiency.  Thankfully, the monthly cycles are improving.  Her MCV is much better.  As such, I have to believe that her iron level should be better.  We will continue to follow her along.  I will plan to see her back in the Spring.  Her son is graduating from [employer] in May.  We will get her back right after graduation.  I told her that if she has any problems, she can always come back sooner and we can check her blood.      Volanda Napoleon, MD 1/24/202412:55 PM

## 2022-08-26 ENCOUNTER — Encounter: Payer: Self-pay | Admitting: *Deleted

## 2022-08-26 LAB — IRON AND IRON BINDING CAPACITY (CC-WL,HP ONLY)
Iron: 85 ug/dL (ref 28–170)
Saturation Ratios: 20 % (ref 10.4–31.8)
TIBC: 437 ug/dL (ref 250–450)
UIBC: 352 ug/dL (ref 148–442)

## 2022-09-17 ENCOUNTER — Ambulatory Visit: Payer: 59 | Admitting: Adult Health

## 2022-09-24 ENCOUNTER — Inpatient Hospital Stay: Payer: 59 | Attending: Hematology & Oncology

## 2022-09-24 VITALS — BP 127/84 | HR 58 | Temp 98.1°F | Resp 17

## 2022-09-24 DIAGNOSIS — N921 Excessive and frequent menstruation with irregular cycle: Secondary | ICD-10-CM | POA: Diagnosis not present

## 2022-09-24 DIAGNOSIS — D500 Iron deficiency anemia secondary to blood loss (chronic): Secondary | ICD-10-CM | POA: Diagnosis present

## 2022-09-24 DIAGNOSIS — D508 Other iron deficiency anemias: Secondary | ICD-10-CM

## 2022-09-24 MED ORDER — SODIUM CHLORIDE 0.9 % IV SOLN
300.0000 mg | Freq: Once | INTRAVENOUS | Status: AC
  Administered 2022-09-24: 300 mg via INTRAVENOUS
  Filled 2022-09-24: qty 300

## 2022-09-24 NOTE — Patient Instructions (Signed)

## 2022-11-12 ENCOUNTER — Ambulatory Visit: Payer: 59 | Admitting: Psychiatry

## 2022-11-12 DIAGNOSIS — F411 Generalized anxiety disorder: Secondary | ICD-10-CM | POA: Diagnosis not present

## 2022-11-12 NOTE — Addendum Note (Signed)
Addended byMathis Fare on: 11/12/2022 11:17 AM   Modules accepted: Level of Service

## 2022-11-12 NOTE — Progress Notes (Signed)
[facility] Counselor/Therapist Progress Note  Patient ID: Kim Gillespie, MRN: [id number],    Date: 11/12/2022  Time Spent: 50 minutes   Treatment Type: Individual Therapy  Reported Symptoms: anxious, has made good progress and continuing to improve  Mental Status Exam:  Appearance:   Casual and Neat     Behavior:  Appropriate, Sharing, and Motivated  Motor:  Normal  Speech/Language:   Clear and Coherent  Affect:  Some anxiety, but progress definitely noted  Mood:  anxious  Thought process:  goal directed  Thought content:    WNL  Sensory/Perceptual disturbances:    WNL  Orientation:  oriented to person, place, time/date, situation, day of week, month of year, year, and stated date of November 12, 2022  Attention:  Good  Concentration:  Good  Memory:  WNL  Fund of knowledge:   Good  Insight:    Good  Judgment:   Good  Impulse Control:  Good   Risk Assessment: Danger to Self:  No  Self-injurious Behavior: No Danger to Others: No Duty to Warn:no Physical Aggression / Violence:No  Access to Firearms a concern: No  Gang Involvement:No   Subjective:  Patient in today with some anxiety, but definitely continuing to improve. Looking at what I can control and what I cannot control, and that helps! Working 4 days a week and going pretty well but leaving options in future open. Still doing yoga some. Good family support. Trying to get out more and have more activities planned. Attending some classes at a senior center and enjoying it. Continuing to decrease her anxiety. Depression "much much better". Remains on her vitamins with benefit. Less self-doubt. Energy increased. Sleep normally good, but more recently is sleeping less (about 6 hrs per night) and plans to speak with her med provider here. Self-confidence improving. "More Ok with where I am at in the moment" and that is feeling good to her.   Interventions: Cognitive Behavioral Therapy and Ego-Supportive  Long-term  goal:. Elevate mood and show evidence of usual energy, activities, and socialization level.  Alleviation of SI. Short-term goal: Verbalize an understanding of the relationship between repressed anger and depressed mood, and work with strategies to help patient express her anger in order to experience some alleviation of depressed mood. Strategies: Replace negative self-defeating self talk with verbalization of realistic and positive cognitive messages.  Diagnosis:   ICD-10-CM   1. Generalized anxiety disorder  F41.1      Plan: Patient in today and doing much better after following through on goal-directed behaviors and is continuing with those behaviors to maintain her progress.  Has done really well in reaching out also and getting involved, as suggested in therapy, and some activities within the community particularly an active senior adult recreation center where most activities are free or a nominal fee for those 51 years old and over.  Patient is involved in a few activities there and is really enjoying it and has met several people there, so feels this has been a positive addition for her.  Sleep has been a little bit decreased recently and she is going to check with her med provider on that as she would like to get back to more regular sleep more than 6 hours a night.  Still relying on her faith, family, friends, and prayer and finds all of these helpful in maintaining her gains.  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  Practicing trying to  address things as they happen rather than letting things build up over time and get worse.  To continue goal-directed behaviors and maintain her gains as she continues to move forward. Encouraged patient in her use of positive behaviors as noted in session including: Continue strategies that are helpful for her as needed in alleviating anxiety and depression as discussed in session, participating in family activities  regularly, focusing on what she can change or control, deep breathing exercises and mindfulness during times of increased stress and anxiety, remain in touch with people who are supportive, physical exercise including walking, continue the habit of looking more for positives versus negatives each day, with the nutrition and exercise, positive self-care and self talk, healthy boundaries with others,  and realize the strength she shows working with goal-directed behaviors to move in a direction that supports her improved emotional health and outlook.  Goal review and progress/challenges noted with patient.  Next appointment within 6 weeks.  This record has been created using [employer].  Chart creation errors have been sought, but may not always have been located and corrected.  Such creation errors do not reflect on the standard of medical care provided.   Mathis Fare, LCSW

## 2022-11-24 ENCOUNTER — Ambulatory Visit: Payer: 59 | Admitting: Hematology & Oncology

## 2022-11-24 ENCOUNTER — Inpatient Hospital Stay: Payer: 59

## 2022-12-09 ENCOUNTER — Inpatient Hospital Stay: Payer: 59 | Attending: Hematology & Oncology

## 2022-12-09 ENCOUNTER — Inpatient Hospital Stay: Payer: 59 | Admitting: Hematology & Oncology

## 2022-12-09 ENCOUNTER — Encounter: Payer: Self-pay | Admitting: Hematology & Oncology

## 2022-12-09 VITALS — BP 144/90 | HR 73 | Temp 98.3°F | Resp 20 | Ht 65.5 in | Wt 206.1 lb

## 2022-12-09 DIAGNOSIS — D500 Iron deficiency anemia secondary to blood loss (chronic): Secondary | ICD-10-CM

## 2022-12-09 DIAGNOSIS — N921 Excessive and frequent menstruation with irregular cycle: Secondary | ICD-10-CM | POA: Insufficient documentation

## 2022-12-09 LAB — RETICULOCYTES
Immature Retic Fract: 10.5 % (ref 2.3–15.9)
RBC.: 4.96 MIL/uL (ref 3.87–5.11)
Retic Count, Absolute: 65 10*3/uL (ref 19.0–186.0)
Retic Ct Pct: 1.3 % (ref 0.4–3.1)

## 2022-12-09 LAB — CBC WITH DIFFERENTIAL (CANCER CENTER ONLY)
Abs Immature Granulocytes: 0.07 10*3/uL (ref 0.00–0.07)
Basophils Absolute: 0.1 10*3/uL (ref 0.0–0.1)
Basophils Relative: 1 %
Eosinophils Absolute: 0.1 10*3/uL (ref 0.0–0.5)
Eosinophils Relative: 1 %
HCT: 43.1 % (ref 36.0–46.0)
Hemoglobin: 14.1 g/dL (ref 12.0–15.0)
Immature Granulocytes: 1 %
Lymphocytes Relative: 21 %
Lymphs Abs: 1.7 10*3/uL (ref 0.7–4.0)
MCH: 28.7 pg (ref 26.0–34.0)
MCHC: 32.7 g/dL (ref 30.0–36.0)
MCV: 87.6 fL (ref 80.0–100.0)
Monocytes Absolute: 0.4 10*3/uL (ref 0.1–1.0)
Monocytes Relative: 5 %
Neutro Abs: 5.6 10*3/uL (ref 1.7–7.7)
Neutrophils Relative %: 71 %
Platelet Count: 324 10*3/uL (ref 150–400)
RBC: 4.92 MIL/uL (ref 3.87–5.11)
RDW: 14.4 % (ref 11.5–15.5)
WBC Count: 7.9 10*3/uL (ref 4.0–10.5)
nRBC: 0 % (ref 0.0–0.2)

## 2022-12-09 LAB — CMP (CANCER CENTER ONLY)
ALT: 80 U/L — ABNORMAL HIGH (ref 0–44)
AST: 37 U/L (ref 15–41)
Albumin: 4.4 g/dL (ref 3.5–5.0)
Alkaline Phosphatase: 45 U/L (ref 38–126)
Anion gap: 8 (ref 5–15)
BUN: 12 mg/dL (ref 6–20)
CO2: 27 mmol/L (ref 22–32)
Calcium: 9.4 mg/dL (ref 8.9–10.3)
Chloride: 103 mmol/L (ref 98–111)
Creatinine: 0.83 mg/dL (ref 0.44–1.00)
GFR, Estimated: >60 mL/min (ref >60)
Glucose, Bld: 145 mg/dL — ABNORMAL HIGH (ref 70–99)
Potassium: 3.4 mmol/L — ABNORMAL LOW (ref 3.5–5.1)
Sodium: 138 mmol/L (ref 135–145)
Total Bilirubin: 0.4 mg/dL (ref 0.3–1.2)
Total Protein: 7.5 g/dL (ref 6.5–8.1)

## 2022-12-09 LAB — IRON AND IRON BINDING CAPACITY (CC-WL,HP ONLY)
Iron: 54 ug/dL (ref 28–170)
Saturation Ratios: 10 % — ABNORMAL LOW (ref 10.4–31.8)
TIBC: 561 ug/dL — ABNORMAL HIGH (ref 250–450)
UIBC: 507 ug/dL — ABNORMAL HIGH (ref 148–442)

## 2022-12-09 LAB — FERRITIN: Ferritin: 7 ng/mL — ABNORMAL LOW (ref 11–307)

## 2022-12-09 NOTE — Progress Notes (Signed)
Hematology and Oncology Follow Up Visit  Kim Gillespie [id number] 09/13/1970 52 y.o. 12/09/2022   Principle Diagnosis:  Iron deficiency anemia secondary to menometrorrhagia  Current Therapy:   IV iron-Venofer given on 09/24/2022      Interim History:  Kim Gillespie is back for follow-up.  She is doing quite well.  We did have to give Kim some iron back in February.  At that time, Kim ferritin was only 15.  She has been quite busy.  Kim Gillespie graduate from [employer].  She is quite happy about this.  Sounds like Kim Gillespie will go on to grad school.  She has been quite busy.  I think she is working.  She still has Kim monthly cycles.  She has had no problems with nausea or vomiting.  She has had no cough or shortness of breath.  She has had no change in bowel or bladder habits.  There has been no rashes.  She has had no leg swelling.  Overall, I would say that Kim performance status is probably ECOG 0.     Medications:  Current Outpatient Medications:    Acyclovir (ZOVIRAX PO), Take by mouth as needed., Disp: , Rfl:    amLODipine (NORVASC) 5 MG tablet, Take 1 tablet (5 mg total) by mouth daily., Disp: 90 tablet, Rfl: 0   ascorbic acid (VITAMIN C) 100 MG tablet, Take 100 mg by mouth daily., Disp: , Rfl:    cyanocobalamin 50 MCG tablet, Take 50 mcg by mouth daily., Disp: , Rfl:    folic acid (FOLVITE) 1 MG tablet, Take 1 tablet (1 mg total) by mouth daily., Disp: 30 tablet, Rfl: 3   hydrochlorothiazide (HYDRODIURIL) 12.5 MG tablet, Take 12.5 mg by mouth daily., Disp: , Rfl:    norethindrone (AYGESTIN) 5 MG tablet, Take 5 mg by mouth 2 (two) times daily., Disp: , Rfl:    sertraline (ZOLOFT) 50 MG tablet, Take 1 tablet (50 mg total) by mouth daily., Disp: 90 tablet, Rfl: 1   fluticasone (FLONASE) 50 MCG/ACT nasal spray, Place 2 sprays into both nostrils daily. (Patient not taking: Reported on 12/09/2022), Disp: 16 g, Rfl: 0  Allergies:  Allergies  Allergen Reactions    Iodine Shortness Of Breath   Iodinated Contrast Media     Coughing spasms, with nausea, and itching post contrast  Coughing spasms, with nausea, and itching post contrast     Past Medical History, Surgical history, Social history, and Family History were reviewed and updated.  Review of Systems: Review of Systems  Constitutional: Negative.   HENT:  Negative.    Eyes: Negative.   Respiratory: Negative.    Cardiovascular: Negative.   Gastrointestinal: Negative.   Endocrine: Negative.   Genitourinary: Negative.    Musculoskeletal: Negative.   Skin: Negative.   Neurological: Negative.   Hematological: Negative.   Psychiatric/Behavioral: Negative.      Physical Exam:  height is 5' 5.5" (1.664 m) and weight is 206 lb 1.9 oz (93.5 kg). Kim oral temperature is 98.3 F (36.8 C). Kim blood pressure is 144/90 (abnormal) and Kim pulse is 73. Kim respiration is 20 and oxygen saturation is 100%.   Wt Readings from Last 3 Encounters:  12/09/22 206 lb 1.9 oz (93.5 kg)  08/25/22 194 lb (88 kg)  06/18/22 193 lb 1.9 oz (87.6 kg)    Physical Exam Vitals reviewed.  HENT:     Head: Normocephalic and atraumatic.  Eyes:     Pupils: Pupils are equal, round, and  reactive to light.  Cardiovascular:     Rate and Rhythm: Normal rate and regular rhythm.     Heart sounds: Normal heart sounds.  Pulmonary:     Effort: Pulmonary effort is normal.     Breath sounds: Normal breath sounds.  Abdominal:     General: Bowel sounds are normal.     Palpations: Abdomen is soft.  Musculoskeletal:        General: No tenderness or deformity. Normal range of motion.     Cervical back: Normal range of motion.  Lymphadenopathy:     Cervical: No cervical adenopathy.  Skin:    General: Skin is warm and dry.     Findings: No erythema or rash.  Neurological:     Mental Status: She is alert and oriented to person, place, and time.  Psychiatric:        Behavior: Behavior normal.        Thought Content: Thought  content normal.        Judgment: Judgment normal.      Lab Results  Component Value Date   WBC 7.9 12/09/2022   HGB 14.1 12/09/2022   HCT 43.1 12/09/2022   MCV 87.6 12/09/2022   PLT 324 12/09/2022     Chemistry      Component Value Date/Time   NA 138 12/09/2022 1303   K 3.4 (L) 12/09/2022 1303   CL 103 12/09/2022 1303   CO2 27 12/09/2022 1303   BUN 12 12/09/2022 1303   CREATININE 0.83 12/09/2022 1303   CREATININE 0.74 08/11/2021 0000      Component Value Date/Time   CALCIUM 9.4 12/09/2022 1303   ALKPHOS 45 12/09/2022 1303   AST 37 12/09/2022 1303   ALT 80 (H) 12/09/2022 1303   BILITOT 0.4 12/09/2022 1303       Impression and Plan: [name] is a very nice 52 year old Afro-American female.  She had menometrorrhagia.  I think this is the reason for Kim iron deficiency.  Thankfully, the monthly cycles are improving.  Kim hemoglobin keeps coming up which is nice to see.  We will have to see what Kim iron levels look like.      Josph Macho, MD 5/9/20242:04 PM

## 2022-12-10 ENCOUNTER — Encounter: Payer: Self-pay | Admitting: *Deleted

## 2022-12-20 ENCOUNTER — Inpatient Hospital Stay: Payer: 59

## 2022-12-20 VITALS — BP 150/90 | HR 75 | Temp 98.4°F | Resp 18

## 2022-12-20 DIAGNOSIS — D500 Iron deficiency anemia secondary to blood loss (chronic): Secondary | ICD-10-CM | POA: Diagnosis not present

## 2022-12-20 DIAGNOSIS — D508 Other iron deficiency anemias: Secondary | ICD-10-CM

## 2022-12-20 MED ORDER — SODIUM CHLORIDE 0.9 % IV SOLN: Freq: Once | INTRAVENOUS | Status: AC

## 2022-12-20 MED ORDER — SODIUM CHLORIDE 0.9 % IV SOLN
300.0000 mg | Freq: Once | INTRAVENOUS | Status: AC
  Administered 2022-12-20: 300 mg via INTRAVENOUS
  Filled 2022-12-20: qty 300

## 2022-12-20 NOTE — Progress Notes (Signed)
At the end of the iron infusion patient complained of having nausea and being "a little lightheaded". Water and orange juice offered and the whole bag of 250 ml normal saline was given during the 30 minutes observation period. After 30 minutes she said that she still have nausea and she is lightheaded, and that "it never happened before". Eileen Stanford, NP was notified. She advised to give the patient Pepcid 20 mg IV for the nausea and tell her to take a Benadryl when she gets home for the lightheadedness. I returned to the patient and inform her of the above. Patient stated that "meantime I went to the bathroom and I do not have nausea anymore". She agreed to take Benadryl at home if she won't notice any improvement. She was advised that we can premedicate her on her future infusions, but she seemed reluctant.

## 2022-12-30 ENCOUNTER — Inpatient Hospital Stay: Payer: 59

## 2022-12-30 VITALS — BP 125/78 | HR 80 | Resp 18

## 2022-12-30 DIAGNOSIS — D500 Iron deficiency anemia secondary to blood loss (chronic): Secondary | ICD-10-CM | POA: Diagnosis not present

## 2022-12-30 DIAGNOSIS — D508 Other iron deficiency anemias: Secondary | ICD-10-CM

## 2022-12-30 MED ORDER — SODIUM CHLORIDE 0.9 % IV SOLN: Freq: Once | INTRAVENOUS | Status: AC

## 2022-12-30 MED ORDER — SODIUM CHLORIDE 0.9 % IV SOLN
300.0000 mg | Freq: Once | INTRAVENOUS | Status: AC
  Administered 2022-12-30: 300 mg via INTRAVENOUS
  Filled 2022-12-30: qty 300

## 2022-12-30 NOTE — Patient Instructions (Signed)

## 2023-03-15 ENCOUNTER — Ambulatory Visit: Payer: 59 | Admitting: Adult Health

## 2023-03-15 ENCOUNTER — Encounter: Payer: Self-pay | Admitting: Adult Health

## 2023-03-15 DIAGNOSIS — F411 Generalized anxiety disorder: Secondary | ICD-10-CM

## 2023-03-15 DIAGNOSIS — F5105 Insomnia due to other mental disorder: Secondary | ICD-10-CM | POA: Diagnosis not present

## 2023-03-15 DIAGNOSIS — F99 Mental disorder, not otherwise specified: Secondary | ICD-10-CM | POA: Diagnosis not present

## 2023-03-15 DIAGNOSIS — F321 Major depressive disorder, single episode, moderate: Secondary | ICD-10-CM | POA: Diagnosis not present

## 2023-03-15 MED ORDER — SERTRALINE HCL 50 MG PO TABS
50.0000 mg | ORAL_TABLET | Freq: Two times a day (BID) | ORAL | 1 refills | Status: DC
Start: 2023-03-15 — End: 2024-03-02

## 2023-03-15 NOTE — Progress Notes (Signed)
Kim Gillespie [id number] [birth year] 51 y.o.  Subjective:   Patient ID:  Kim Gillespie is a 52 y.o. (DOB July 14, 1971) female.  Chief Complaint: No chief complaint on file.   HPI Kim Gillespie presents to the office today for follow-up of MDD, GAD and insomnia.  Describes mood today as "improving". Pleasant. Denies tearfulness. Mood symptoms - denies depression and irritability. Denies anxiety. Reports some worry, rumination, and over thinking. Reports having interest, but to motivation. Mood is consistent. Stating "I've been feeling more anxious and nervous". Reports she is unable to identify a precipitant. Has been taking Zoloft 50mg  daily, but feels like she may need an increase to help manage mood symptoms. Taking medications as prescribed. Energy levels improved. Active, exercising some days- yoga. Enjoys some usual interests and activities. Married. Lives with husband - 3 children - child at home. Spending time with family. No extended family local.  Appetite adequate. Weight gain - 198 - 218 pounds. Sleeps better some nights than others. Averages 6 to 7 hours. Focus and concentration improved, but still having issues. Completing tasksg. Managing some aspects of household. Has returned to work 8 hours a day - 4 days a week. Denies SI or HI. Denies AH or VH. Denies recent self harm. Denies substance use.  Previous medication trials: Denies   GAD-7    Flowsheet Row Office Visit from 08/11/2021 in [facility] at [facility]  Total GAD-7 Score 1      PHQ2-9    Flowsheet Row Office Visit from 08/11/2021 in [facility] at [facility] Total Score 2  PHQ-9 Total Score 10        Review of Systems:  Review of Systems  Musculoskeletal:  Negative for gait problem.  Neurological:  Negative for tremors.  Psychiatric/Behavioral:         Please refer to HPI    Medications: I have reviewed the  patient's current medications.  Current Outpatient Medications  Medication Sig Dispense Refill   Acyclovir (ZOVIRAX PO) Take by mouth as needed.     amLODipine (NORVASC) 5 MG tablet Take 1 tablet (5 mg total) by mouth daily. 90 tablet 0   ascorbic acid (VITAMIN C) 100 MG tablet Take 100 mg by mouth daily.     cyanocobalamin 50 MCG tablet Take 50 mcg by mouth daily.     fluticasone (FLONASE) 50 MCG/ACT nasal spray Place 2 sprays into both nostrils daily. (Patient not taking: Reported on 12/09/2022) 16 g 0   folic acid (FOLVITE) 1 MG tablet Take 1 tablet (1 mg total) by mouth daily. 30 tablet 3   hydrochlorothiazide (HYDRODIURIL) 12.5 MG tablet Take 12.5 mg by mouth daily.     norethindrone (AYGESTIN) 5 MG tablet Take 5 mg by mouth 2 (two) times daily.     sertraline (ZOLOFT) 50 MG tablet Take 1 tablet (50 mg total) by mouth 2 (two) times daily. 180 tablet 1   No current facility-administered medications for this visit.    Medication Side Effects: None  Allergies:  Allergies  Allergen Reactions   Iodine Shortness Of Breath   Iodinated Contrast Media     Coughing spasms, with nausea, and itching post contrast  Coughing spasms, with nausea, and itching post contrast     Past Medical History:  Diagnosis Date   Goals of care, counseling/discussion 09/09/2021   Iron deficiency anemia 09/09/2021   Menometrorrhagia 09/09/2021    Past Medical History, Surgical history, Social  history, and Family history were reviewed and updated as appropriate.   Please see review of systems for further details on the patient's review from today.   Objective:   Physical Exam:  There were no vitals taken for this visit.  Physical Exam Constitutional:      General: She is not in acute distress. Musculoskeletal:        General: No deformity.  Neurological:     Mental Status: She is alert and oriented to person, place, and time.     Coordination: Coordination normal.  Psychiatric:        Attention and  Perception: Attention and perception normal. She does not perceive auditory or visual hallucinations.        Mood and Affect: Mood normal. Mood is not anxious or depressed. Affect is not labile, blunt, angry or inappropriate.        Speech: Speech normal.        Behavior: Behavior normal.        Thought Content: Thought content normal. Thought content is not paranoid or delusional. Thought content does not include homicidal or suicidal ideation. Thought content does not include homicidal or suicidal plan.        Cognition and Memory: Cognition and memory normal.        Judgment: Judgment normal.     Comments: Insight intact     Lab Review:     Component Value Date/Time   NA 138 12/09/2022 1303   K 3.4 (L) 12/09/2022 1303   CL 103 12/09/2022 1303   CO2 27 12/09/2022 1303   GLUCOSE 145 (H) 12/09/2022 1303   BUN 12 12/09/2022 1303   CREATININE 0.83 12/09/2022 1303   CREATININE 0.74 08/11/2021 0000   CALCIUM 9.4 12/09/2022 1303   PROT 7.5 12/09/2022 1303   ALBUMIN 4.4 12/09/2022 1303   AST 37 12/09/2022 1303   ALT 80 (H) 12/09/2022 1303   ALKPHOS 45 12/09/2022 1303   BILITOT 0.4 12/09/2022 1303   GFRNONAA >60 12/09/2022 1303       Component Value Date/Time   WBC 7.9 12/09/2022 1303   WBC 7.9 08/11/2021 0000   RBC 4.92 12/09/2022 1303   RBC 4.96 12/09/2022 1303   HGB 14.1 12/09/2022 1303   HCT 43.1 12/09/2022 1303   PLT 324 12/09/2022 1303   MCV 87.6 12/09/2022 1303   MCH 28.7 12/09/2022 1303   MCHC 32.7 12/09/2022 1303   RDW 14.4 12/09/2022 1303   LYMPHSABS 1.7 12/09/2022 1303   MONOABS 0.4 12/09/2022 1303   EOSABS 0.1 12/09/2022 1303   BASOSABS 0.1 12/09/2022 1303    No results found for: "POCLITH", "LITHIUM"   No results found for: "PHENYTOIN", "PHENOBARB", "VALPROATE", "CBMZ"   .res Assessment: Plan:    Plan:  Increase Zoloft 50mg  daily to twice daily  Time spent with patient was 20 minutes. Greater than 50% of face to face time with patient was spent on  counseling and coordination of care.    RTC 2 months - may opt to return in 6 months if feeling better.  Patient advised to contact office with any questions, adverse effects, or acute worsening in signs and symptoms.  Diagnoses and all orders for this visit:  Depression, major, single episode, moderate (HCC) -     sertraline (ZOLOFT) 50 MG tablet; Take 1 tablet (50 mg total) by mouth 2 (two) times daily.  Generalized anxiety disorder -     sertraline (ZOLOFT) 50 MG tablet; Take 1 tablet (50 mg total) by  mouth 2 (two) times daily.  Insomnia due to other mental disorder -     sertraline (ZOLOFT) 50 MG tablet; Take 1 tablet (50 mg total) by mouth 2 (two) times daily.     Please see After Visit Summary for patient specific instructions.  Future Appointments  Date Time Provider Department Center  04/29/2023  8:15 AM [facility] None  04/29/2023  8:30 AM Ennever, Rose Phi, MD [facility] None    No orders of the defined types were placed in this encounter.   -------------------------------

## 2023-03-18 ENCOUNTER — Ambulatory Visit: Payer: 59 | Admitting: Adult Health

## 2023-03-18 ENCOUNTER — Other Ambulatory Visit: Payer: Self-pay | Admitting: Adult Health

## 2023-03-18 DIAGNOSIS — F321 Major depressive disorder, single episode, moderate: Secondary | ICD-10-CM

## 2023-03-18 DIAGNOSIS — F411 Generalized anxiety disorder: Secondary | ICD-10-CM

## 2023-03-18 DIAGNOSIS — F5105 Insomnia due to other mental disorder: Secondary | ICD-10-CM

## 2023-04-29 ENCOUNTER — Inpatient Hospital Stay: Payer: 59 | Attending: Hematology & Oncology

## 2023-04-29 ENCOUNTER — Other Ambulatory Visit: Payer: Self-pay

## 2023-04-29 ENCOUNTER — Inpatient Hospital Stay: Payer: 59 | Admitting: Hematology & Oncology

## 2023-04-29 ENCOUNTER — Encounter: Payer: Self-pay | Admitting: Hematology & Oncology

## 2023-04-29 VITALS — BP 128/88 | HR 80 | Temp 99.2°F | Resp 18 | Ht 65.0 in | Wt 207.1 lb

## 2023-04-29 DIAGNOSIS — D510 Vitamin B12 deficiency anemia due to intrinsic factor deficiency: Secondary | ICD-10-CM | POA: Diagnosis not present

## 2023-04-29 DIAGNOSIS — N921 Excessive and frequent menstruation with irregular cycle: Secondary | ICD-10-CM | POA: Insufficient documentation

## 2023-04-29 DIAGNOSIS — D500 Iron deficiency anemia secondary to blood loss (chronic): Secondary | ICD-10-CM

## 2023-04-29 DIAGNOSIS — I10 Essential (primary) hypertension: Secondary | ICD-10-CM | POA: Diagnosis not present

## 2023-04-29 LAB — CBC WITH DIFFERENTIAL (CANCER CENTER ONLY)
Abs Immature Granulocytes: 0.01 10*3/uL (ref 0.00–0.07)
Basophils Absolute: 0 10*3/uL (ref 0.0–0.1)
Basophils Relative: 1 %
Eosinophils Absolute: 0.1 10*3/uL (ref 0.0–0.5)
Eosinophils Relative: 1 %
HCT: 43.2 % (ref 36.0–46.0)
Hemoglobin: 14.7 g/dL (ref 12.0–15.0)
Immature Granulocytes: 0 %
Lymphocytes Relative: 24 %
Lymphs Abs: 1.5 10*3/uL (ref 0.7–4.0)
MCH: 31.1 pg (ref 26.0–34.0)
MCHC: 34 g/dL (ref 30.0–36.0)
MCV: 91.3 fL (ref 80.0–100.0)
Monocytes Absolute: 0.3 10*3/uL (ref 0.1–1.0)
Monocytes Relative: 6 %
Neutro Abs: 4.3 10*3/uL (ref 1.7–7.7)
Neutrophils Relative %: 68 %
Platelet Count: 270 10*3/uL (ref 150–400)
RBC: 4.73 MIL/uL (ref 3.87–5.11)
RDW: 12.9 % (ref 11.5–15.5)
WBC Count: 6.2 10*3/uL (ref 4.0–10.5)
nRBC: 0 % (ref 0.0–0.2)

## 2023-04-29 LAB — CMP (CANCER CENTER ONLY)
ALT: 25 U/L (ref 0–44)
AST: 17 U/L (ref 15–41)
Albumin: 4.1 g/dL (ref 3.5–5.0)
Alkaline Phosphatase: 54 U/L (ref 38–126)
Anion gap: 7 (ref 5–15)
BUN: 18 mg/dL (ref 6–20)
CO2: 30 mmol/L (ref 22–32)
Calcium: 9.3 mg/dL (ref 8.9–10.3)
Chloride: 105 mmol/L (ref 98–111)
Creatinine: 0.85 mg/dL (ref 0.44–1.00)
GFR, Estimated: >60 mL/min (ref >60)
Glucose, Bld: 96 mg/dL (ref 70–99)
Potassium: 3.9 mmol/L (ref 3.5–5.1)
Sodium: 142 mmol/L (ref 135–145)
Total Bilirubin: 0.4 mg/dL (ref 0.3–1.2)
Total Protein: 7.2 g/dL (ref 6.5–8.1)

## 2023-04-29 LAB — IRON AND IRON BINDING CAPACITY (CC-WL,HP ONLY)
Iron: 113 ug/dL (ref 28–170)
Saturation Ratios: 31 % (ref 10.4–31.8)
TIBC: 364 ug/dL (ref 250–450)
UIBC: 251 ug/dL (ref 148–442)

## 2023-04-29 LAB — RETICULOCYTES
Immature Retic Fract: 8 % (ref 2.3–15.9)
RBC.: 4.83 MIL/uL (ref 3.87–5.11)
Retic Count, Absolute: 71 10*3/uL (ref 19.0–186.0)
Retic Ct Pct: 1.5 % (ref 0.4–3.1)

## 2023-04-29 LAB — TSH: TSH: 1.413 u[IU]/mL (ref 0.350–4.500)

## 2023-04-29 LAB — FERRITIN: Ferritin: 88 ng/mL (ref 11–307)

## 2023-04-29 NOTE — Progress Notes (Signed)
Hematology and Oncology Follow Up Visit  Kim Gillespie [id number] Sep 17, 1970 52 y.o. 04/29/2023   Principle Diagnosis:  Iron deficiency anemia secondary to menometrorrhagia  Current Therapy:   IV iron-Venofer given on 12/30/2022      Interim History:  Kim Gillespie is back for follow-up.  She did have endometrial ablation back in August I think.  This is helped a little bit.  I do not think we will probably know for another month or so.  Back in May, when we last  saw her, she did get some IV iron.  At that time, her ferritin was only 7 with an iron saturation of 10%.  It would not surprise me if her iron is still on the lower side.  I am also going to check her thyroid.  Her last time the TSH was checked was over a year and a half ago.  She is having little bit of abdominal discomfort.  There is been no change in bowel or bladder habits.  She has not noted any obvious bleeding so that with the monthly cycle.  She has had decent appetite.  She is not a vegetarian.  Her last vitamin B12 level a year ago was 285.  We may have to check this again.  Overall, I would say that her performance status is probably ECOG 1.  Medications:  Current Outpatient Medications:    Acyclovir (ZOVIRAX PO), Take by mouth as needed., Disp: , Rfl:    amLODipine (NORVASC) 5 MG tablet, Take 1 tablet (5 mg total) by mouth daily., Disp: 90 tablet, Rfl: 0   ascorbic acid (VITAMIN C) 100 MG tablet, Take 100 mg by mouth daily., Disp: , Rfl:    cyanocobalamin 50 MCG tablet, Take 50 mcg by mouth daily., Disp: , Rfl:    fluticasone (FLONASE) 50 MCG/ACT nasal spray, Place 2 sprays into both nostrils daily., Disp: 16 g, Rfl: 0   folic acid (FOLVITE) 1 MG tablet, Take 1 tablet (1 mg total) by mouth daily., Disp: 30 tablet, Rfl: 3   hydrochlorothiazide (HYDRODIURIL) 12.5 MG tablet, Take 12.5 mg by mouth daily., Disp: , Rfl:    norethindrone (AYGESTIN) 5 MG tablet, Take 5 mg by mouth 2 (two) times daily., Disp: , Rfl:     sertraline (ZOLOFT) 50 MG tablet, Take 1 tablet (50 mg total) by mouth 2 (two) times daily., Disp: 180 tablet, Rfl: 1  Allergies:  Allergies  Allergen Reactions   Iodine Shortness Of Breath   Iodinated Contrast Media Itching, Nausea Only and Cough    Past Medical History, Surgical history, Social history, and Family History were reviewed and updated.  Review of Systems: Review of Systems  Constitutional: Negative.   HENT:  Negative.    Eyes: Negative.   Respiratory: Negative.    Cardiovascular: Negative.   Gastrointestinal: Negative.   Endocrine: Negative.   Genitourinary: Negative.    Musculoskeletal: Negative.   Skin: Negative.   Neurological: Negative.   Hematological: Negative.   Psychiatric/Behavioral: Negative.      Physical Exam:  height is 5\' 5"  (1.651 m) and weight is 207 lb 1.3 oz (93.9 kg). Her oral temperature is 99.2 F (37.3 C). Her blood pressure is 128/88 and her pulse is 80. Her respiration is 18 and oxygen saturation is 100%.   Wt Readings from Last 3 Encounters:  04/29/23 207 lb 1.3 oz (93.9 kg)  12/09/22 206 lb 1.9 oz (93.5 kg)  08/25/22 194 lb (88 kg)    Physical Exam Vitals reviewed.  HENT:     Head: Normocephalic and atraumatic.  Eyes:     Pupils: Pupils are equal, round, and reactive to light.  Cardiovascular:     Rate and Rhythm: Normal rate and regular rhythm.     Heart sounds: Normal heart sounds.  Pulmonary:     Effort: Pulmonary effort is normal.     Breath sounds: Normal breath sounds.  Abdominal:     General: Bowel sounds are normal.     Palpations: Abdomen is soft.  Musculoskeletal:        General: No tenderness or deformity. Normal range of motion.     Cervical back: Normal range of motion.  Lymphadenopathy:     Cervical: No cervical adenopathy.  Skin:    General: Skin is warm and dry.     Findings: No erythema or rash.  Neurological:     Mental Status: She is alert and oriented to person, place, and time.   Psychiatric:        Behavior: Behavior normal.        Thought Content: Thought content normal.        Judgment: Judgment normal.      Lab Results  Component Value Date   WBC 6.2 04/29/2023   HGB 14.7 04/29/2023   HCT 43.2 04/29/2023   MCV 91.3 04/29/2023   PLT 270 04/29/2023     Chemistry      Component Value Date/Time   NA 142 04/29/2023 0814   K 3.9 04/29/2023 0814   CL 105 04/29/2023 0814   CO2 30 04/29/2023 0814   BUN 18 04/29/2023 0814   CREATININE 0.85 04/29/2023 0814   CREATININE 0.74 08/11/2021 0000      Component Value Date/Time   CALCIUM 9.3 04/29/2023 0814   ALKPHOS 54 04/29/2023 0814   AST 17 04/29/2023 0814   ALT 25 04/29/2023 0814   BILITOT 0.4 04/29/2023 0814       Impression and Plan: Kim Gillespie is a very nice 52 year old Afro-American female.  She has known deficiency anemia.  She has a history of menometrorrhagia.  She did have some endometrial ablation.  We selected check her iron studies.  We will see if the iron is still on the lower side.  I guess it would not surprise me if it was.  However, her MCV is quite good.  We will go ahead and plan to get her back in another couple months.  I would like to try to get her into the Holidays.  I want to make sure that she is feeling okay so she can enjoy the Holiday season.     Josph Macho, MD 9/27/20249:16 AM

## 2023-05-17 ENCOUNTER — Ambulatory Visit (INDEPENDENT_AMBULATORY_CARE_PROVIDER_SITE_OTHER): Payer: Self-pay | Admitting: Adult Health

## 2023-05-17 DIAGNOSIS — Z0389 Encounter for observation for other suspected diseases and conditions ruled out: Secondary | ICD-10-CM

## 2023-05-17 NOTE — Progress Notes (Signed)
Patient no show appointment. ? ?

## 2023-06-15 ENCOUNTER — Encounter: Payer: Self-pay | Admitting: Psychiatry

## 2023-06-17 ENCOUNTER — Inpatient Hospital Stay: Payer: 59 | Attending: Hematology & Oncology

## 2023-06-17 ENCOUNTER — Inpatient Hospital Stay (HOSPITAL_BASED_OUTPATIENT_CLINIC_OR_DEPARTMENT_OTHER): Payer: 59 | Admitting: Hematology & Oncology

## 2023-06-17 ENCOUNTER — Encounter: Payer: Self-pay | Admitting: Hematology & Oncology

## 2023-06-17 VITALS — BP 130/83 | HR 61 | Temp 98.1°F | Resp 20 | Ht 65.0 in | Wt 205.0 lb

## 2023-06-17 DIAGNOSIS — D510 Vitamin B12 deficiency anemia due to intrinsic factor deficiency: Secondary | ICD-10-CM

## 2023-06-17 DIAGNOSIS — D500 Iron deficiency anemia secondary to blood loss (chronic): Secondary | ICD-10-CM | POA: Diagnosis not present

## 2023-06-17 DIAGNOSIS — N921 Excessive and frequent menstruation with irregular cycle: Secondary | ICD-10-CM | POA: Insufficient documentation

## 2023-06-17 DIAGNOSIS — I10 Essential (primary) hypertension: Secondary | ICD-10-CM

## 2023-06-17 LAB — IRON AND IRON BINDING CAPACITY (CC-WL,HP ONLY)
Iron: 76 ug/dL (ref 28–170)
Saturation Ratios: 21 % (ref 10.4–31.8)
TIBC: 360 ug/dL (ref 250–450)
UIBC: 284 ug/dL (ref 148–442)

## 2023-06-17 LAB — CMP (CANCER CENTER ONLY)
ALT: 20 U/L (ref 0–44)
AST: 14 U/L — ABNORMAL LOW (ref 15–41)
Albumin: 4.4 g/dL (ref 3.5–5.0)
Alkaline Phosphatase: 69 U/L (ref 38–126)
Anion gap: 7 (ref 5–15)
BUN: 18 mg/dL (ref 6–20)
CO2: 30 mmol/L (ref 22–32)
Calcium: 9.7 mg/dL (ref 8.9–10.3)
Chloride: 104 mmol/L (ref 98–111)
Creatinine: 0.83 mg/dL (ref 0.44–1.00)
GFR, Estimated: >60 mL/min (ref >60)
Glucose, Bld: 108 mg/dL — ABNORMAL HIGH (ref 70–99)
Potassium: 3.9 mmol/L (ref 3.5–5.1)
Sodium: 141 mmol/L (ref 135–145)
Total Bilirubin: 0.4 mg/dL (ref <1.2)
Total Protein: 7.1 g/dL (ref 6.5–8.1)

## 2023-06-17 LAB — CBC WITH DIFFERENTIAL (CANCER CENTER ONLY)
Abs Immature Granulocytes: 0.04 10*3/uL (ref 0.00–0.07)
Basophils Absolute: 0 10*3/uL (ref 0.0–0.1)
Basophils Relative: 1 %
Eosinophils Absolute: 0.1 10*3/uL (ref 0.0–0.5)
Eosinophils Relative: 1 %
HCT: 43.3 % (ref 36.0–46.0)
Hemoglobin: 14.3 g/dL (ref 12.0–15.0)
Immature Granulocytes: 1 %
Lymphocytes Relative: 16 %
Lymphs Abs: 1.3 10*3/uL (ref 0.7–4.0)
MCH: 30.7 pg (ref 26.0–34.0)
MCHC: 33 g/dL (ref 30.0–36.0)
MCV: 92.9 fL (ref 80.0–100.0)
Monocytes Absolute: 0.6 10*3/uL (ref 0.1–1.0)
Monocytes Relative: 7 %
Neutro Abs: 6.3 10*3/uL (ref 1.7–7.7)
Neutrophils Relative %: 74 %
Platelet Count: 278 10*3/uL (ref 150–400)
RBC: 4.66 MIL/uL (ref 3.87–5.11)
RDW: 13.4 % (ref 11.5–15.5)
WBC Count: 8.4 10*3/uL (ref 4.0–10.5)
nRBC: 0 % (ref 0.0–0.2)

## 2023-06-17 LAB — VITAMIN B12: Vitamin B-12: 274 pg/mL (ref 180–914)

## 2023-06-17 LAB — RETICULOCYTES
Immature Retic Fract: 11.4 % (ref 2.3–15.9)
RBC.: 4.66 MIL/uL (ref 3.87–5.11)
Retic Count, Absolute: 75.5 10*3/uL (ref 19.0–186.0)
Retic Ct Pct: 1.6 % (ref 0.4–3.1)

## 2023-06-17 NOTE — Progress Notes (Signed)
Hematology and Oncology Follow Up Visit  Kim Gillespie [id number] July 11, 1971 52 y.o. 06/17/2023   Principle Diagnosis:  Iron deficiency anemia secondary to menometrorrhagia  Current Therapy:   IV iron-Venofer given on 12/30/2022      Interim History:  Kim Gillespie is back for follow-up.  She is doing okay.  She really has no complaints.  She has had no real fatigue or weakness.  I think she said that she did have a monthly cycle that was relatively normal.  When we last saw her back in September, her ferritin was 88 with an iron saturation of 31%.    She has had no problems with nausea or vomiting.  She has had no problems with bowels or bladder.  She has had no cough or shortness of breath.  There has been no issues with COVID.  She is in have a very nice holiday season.  I am sure she will be with family.  Patient a wonderful birthday last week.  Again was quite busy for her.  Overall, I would say that her performance status is probably ECOG 1.  Okay well his labs back yet but I  Medications:  Current Outpatient Medications:    Acyclovir (ZOVIRAX PO), Take by mouth as needed., Disp: , Rfl:    amLODipine (NORVASC) 5 MG tablet, Take 1 tablet (5 mg total) by mouth daily., Disp: 90 tablet, Rfl: 0   ascorbic acid (VITAMIN C) 100 MG tablet, Take 100 mg by mouth daily., Disp: , Rfl:    cyanocobalamin 50 MCG tablet, Take 50 mcg by mouth daily., Disp: , Rfl:    folic acid (FOLVITE) 1 MG tablet, Take 1 tablet (1 mg total) by mouth daily., Disp: 30 tablet, Rfl: 3   hydrochlorothiazide (HYDRODIURIL) 12.5 MG tablet, Take 12.5 mg by mouth daily., Disp: , Rfl:    sertraline (ZOLOFT) 50 MG tablet, Take 1 tablet (50 mg total) by mouth 2 (two) times daily., Disp: 180 tablet, Rfl: 1   fluticasone (FLONASE) 50 MCG/ACT nasal spray, Place 2 sprays into both nostrils daily. (Patient not taking: Reported on 06/17/2023), Disp: 16 g, Rfl: 0  Allergies:  Allergies  Allergen Reactions   Iodine Shortness  Of Breath   Iodinated Contrast Media Itching, Nausea Only and Cough    Past Medical History, Surgical history, Social history, and Family History were reviewed and updated.  Review of Systems: Review of Systems  Constitutional: Negative.   HENT:  Negative.    Eyes: Negative.   Respiratory: Negative.    Cardiovascular: Negative.   Gastrointestinal: Negative.   Endocrine: Negative.   Genitourinary: Negative.    Musculoskeletal: Negative.   Skin: Negative.   Neurological: Negative.   Hematological: Negative.   Psychiatric/Behavioral: Negative.      Physical Exam:  height is 5\' 5"  (1.651 m) and weight is 205 lb 0.6 oz (93 kg). Her oral temperature is 98.1 F (36.7 C). Her blood pressure is 130/83 and her pulse is 61. Her respiration is 20 and oxygen saturation is 98%.   Wt Readings from Last 3 Encounters:  06/17/23 205 lb 0.6 oz (93 kg)  04/29/23 207 lb 1.3 oz (93.9 kg)  12/09/22 206 lb 1.9 oz (93.5 kg)    Physical Exam Vitals reviewed.  HENT:     Head: Normocephalic and atraumatic.  Eyes:     Pupils: Pupils are equal, round, and reactive to light.  Cardiovascular:     Rate and Rhythm: Normal rate and regular rhythm.  Heart sounds: Normal heart sounds.  Pulmonary:     Effort: Pulmonary effort is normal.     Breath sounds: Normal breath sounds.  Abdominal:     General: Bowel sounds are normal.     Palpations: Abdomen is soft.  Musculoskeletal:        General: No tenderness or deformity. Normal range of motion.     Cervical back: Normal range of motion.  Lymphadenopathy:     Cervical: No cervical adenopathy.  Skin:    General: Skin is warm and dry.     Findings: No erythema or rash.  Neurological:     Mental Status: She is alert and oriented to person, place, and time.  Psychiatric:        Behavior: Behavior normal.        Thought Content: Thought content normal.        Judgment: Judgment normal.      Lab Results  Component Value Date   WBC 8.4  06/17/2023   HGB 14.3 06/17/2023   HCT 43.3 06/17/2023   MCV 92.9 06/17/2023   PLT 278 06/17/2023     Chemistry      Component Value Date/Time   NA 141 06/17/2023 0816   K 3.9 06/17/2023 0816   CL 104 06/17/2023 0816   CO2 30 06/17/2023 0816   BUN 18 06/17/2023 0816   CREATININE 0.83 06/17/2023 0816   CREATININE 0.74 08/11/2021 0000      Component Value Date/Time   CALCIUM 9.7 06/17/2023 0816   ALKPHOS 69 06/17/2023 0816   AST 14 (L) 06/17/2023 0816   ALT 20 06/17/2023 0816   BILITOT 0.4 06/17/2023 0816       Impression and Plan: Kim Gillespie is a very nice 52 year old Afro-American female.  She has known deficiency anemia.  She has a history of menometrorrhagia.  She did have an endometrial ablation.  Her hemoglobin is holding steady.  I am happy about this.  At this time, I really think that we can just have her come back as needed.  I told her that if she thinks that her blood work is on the low side, she can always let us know and we can always get her back for a lab check only.  This make it a lot easier for her.  She is quite busy.  I am glad that she had a wonderful birthday.  Again, I am sure that she will have a wonderful Holiday season.  Josph Macho, MD 11/15/20249:06 AM

## 2023-06-20 ENCOUNTER — Encounter: Payer: Self-pay | Admitting: *Deleted

## 2023-06-24 ENCOUNTER — Ambulatory Visit
Admission: EM | Admit: 2023-06-24 | Discharge: 2023-06-24 | Disposition: A | Discharge status: Home / Self Care | Payer: 59 | Attending: Internal Medicine | Admitting: Internal Medicine

## 2023-06-24 DIAGNOSIS — J988 Other specified respiratory disorders: Secondary | ICD-10-CM | POA: Diagnosis not present

## 2023-06-24 DIAGNOSIS — H65192 Other acute nonsuppurative otitis media, left ear: Secondary | ICD-10-CM

## 2023-06-24 DIAGNOSIS — B9789 Other viral agents as the cause of diseases classified elsewhere: Secondary | ICD-10-CM

## 2023-06-24 MED ORDER — AMOXICILLIN 875 MG PO TABS: 875.0000 mg | ORAL_TABLET | Freq: Two times a day (BID) | ORAL | 0 refills | Status: AC

## 2023-06-24 MED ORDER — PSEUDOEPHEDRINE HCL 60 MG PO TABS
60.0000 mg | ORAL_TABLET | Freq: Three times a day (TID) | ORAL | 0 refills | Status: AC | PRN
Start: 2023-06-24 — End: ?

## 2023-06-24 MED ORDER — CETIRIZINE HCL 10 MG PO TABS
10.0000 mg | ORAL_TABLET | Freq: Every day | ORAL | 0 refills | Status: AC
Start: 2023-06-24 — End: ?

## 2023-06-24 NOTE — ED Provider Notes (Signed)
[facility]  Note:  This document was prepared using Conservation officer, historic buildings and may include unintentional dictation errors.  MRN: [id number] DOB: 14-Nov-1970  Subjective:   Kim Gillespie is a 52 y.o. female presenting for 4-day history of acute onset coughing, sinus congestion, chest congestion, fatigue.  Has been using Sudafed, Tylenol Cold and flu.  Had a negative COVID test at home.  No chest pain, shortness of breath or wheezing.  No history of asthma.  No smoking of any kind including cigarettes, cigars, vaping, marijuana use.    No current facility-administered medications for this encounter.  Current Outpatient Medications:    Acyclovir (ZOVIRAX PO), Take by mouth as needed., Disp: , Rfl:    amLODipine (NORVASC) 5 MG tablet, Take 1 tablet (5 mg total) by mouth daily., Disp: 90 tablet, Rfl: 0   ascorbic acid (VITAMIN C) 100 MG tablet, Take 100 mg by mouth daily., Disp: , Rfl:    cyanocobalamin 50 MCG tablet, Take 50 mcg by mouth daily., Disp: , Rfl:    fluticasone (FLONASE) 50 MCG/ACT nasal spray, Place 2 sprays into both nostrils daily. (Patient not taking: Reported on 06/17/2023), Disp: 16 g, Rfl: 0   folic acid (FOLVITE) 1 MG tablet, Take 1 tablet (1 mg total) by mouth daily., Disp: 30 tablet, Rfl: 3   hydrochlorothiazide (HYDRODIURIL) 12.5 MG tablet, Take 12.5 mg by mouth daily., Disp: , Rfl:    sertraline (ZOLOFT) 50 MG tablet, Take 1 tablet (50 mg total) by mouth 2 (two) times daily., Disp: 180 tablet, Rfl: 1   Allergies  Allergen Reactions   Iodine Shortness Of Breath   Iodinated Contrast Media Itching, Nausea Only and Cough    Past Medical History:  Diagnosis Date   Goals of care, counseling/discussion 09/09/2021   Iron deficiency anemia 09/09/2021   Menometrorrhagia 09/09/2021     Past Surgical History:  Procedure Laterality Date   TUBAL LIGATION      Family History  Problem Relation Age of Onset   Hypertension Mother    Diabetes  Father    Hypertension Father    Hypertension Brother     Social History   Tobacco Use   Smoking status: Never   Smokeless tobacco: Never  Vaping Use   Vaping status: Never Used  Substance Use Topics   Alcohol use: Never   Drug use: Never    ROS   Objective:   Vitals: BP (!) 152/98 (BP Location: Left Arm)   Pulse 65   Temp 98.9 F (37.2 C) (Oral)   Resp 20   LMP 06/10/2023 (Approximate)   SpO2 97%   Physical Exam Constitutional:      General: She is not in acute distress.    Appearance: Normal appearance. She is well-developed and normal weight. She is not ill-appearing, toxic-appearing or diaphoretic.  HENT:     Head: Normocephalic and atraumatic.     Right Ear: Tympanic membrane, ear canal and external ear normal. No drainage or tenderness. No middle ear effusion. There is no impacted cerumen. Tympanic membrane is not erythematous or bulging.     Left Ear: Ear canal and external ear normal. No drainage or tenderness.  No middle ear effusion. There is no impacted cerumen. Tympanic membrane is erythematous and bulging.     Nose: Nose normal. No congestion or rhinorrhea.     Mouth/Throat:     Mouth: Mucous membranes are moist. No oral lesions.     Pharynx: No pharyngeal swelling, oropharyngeal  exudate, posterior oropharyngeal erythema or uvula swelling.     Tonsils: No tonsillar exudate or tonsillar abscesses.  Eyes:     General: No scleral icterus.       Right eye: No discharge.        Left eye: No discharge.     Extraocular Movements: Extraocular movements intact.     Right eye: Normal extraocular motion.     Left eye: Normal extraocular motion.     Conjunctiva/sclera: Conjunctivae normal.  Cardiovascular:     Rate and Rhythm: Normal rate and regular rhythm.     Heart sounds: Normal heart sounds. No murmur heard.    No friction rub. No gallop.  Pulmonary:     Effort: Pulmonary effort is normal. No respiratory distress.     Breath sounds: No stridor. No  wheezing, rhonchi or rales.  Chest:     Chest wall: No tenderness.  Musculoskeletal:     Cervical back: Normal range of motion and neck supple.  Lymphadenopathy:     Cervical: No cervical adenopathy.  Skin:    General: Skin is warm and dry.  Neurological:     General: No focal deficit present.     Mental Status: She is alert and oriented to person, place, and time.  Psychiatric:        Mood and Affect: Mood normal.        Behavior: Behavior normal.     Assessment and Plan :   PDMP not reviewed this encounter.  1. Other non-recurrent acute nonsuppurative otitis media of left ear   2. Viral respiratory infection    Patient has had a viral respiratory infection that complicated to otitis media of the left ear.  Recommend amoxicillin to address bacterial otitis media.  Use supportive care otherwise.  Deferred imaging given clear cardiopulmonary exam, hemodynamically stable vital signs.    Wallis Bamberg, [place] 06/24/23 1944

## 2023-06-24 NOTE — Discharge Instructions (Signed)
We will manage this as a left ear infection with amoxicillin. This is a complication of a virus infection, a common cold.  For sore throat or cough try using a honey-based tea. Use 3 teaspoons of honey with juice squeezed from half lemon. Place shaved pieces of ginger into 1/2-1 cup of water and warm over stove top. Then mix the ingredients and repeat every 4 hours as needed. Please take ibuprofen 600mg  every 6 hours with food alternating with OR taken together with Tylenol 500mg -650mg  every 6 hours for throat pain, fevers, aches and pains. Hydrate very well with at least 2 liters of water. Eat light meals such as soups (chicken and noodles, vegetable, chicken and wild rice).  Do not eat foods that you are allergic to.  Taking an antihistamine like Zyrtec can help against postnasal drainage, sinus congestion which can cause sinus pain, sinus headaches, throat pain, painful swallowing, coughing.  You can take this together with pseudoephedrine (Sudafed) at a dose of 60 mg 3 times a day or twice daily as needed for the same kind of nasal drip, congestion.  Use cough medication as needed.

## 2023-06-24 NOTE — ED Triage Notes (Signed)
Pt c/o cough, head/chest congestion, fatigue x 4 days-taking sudafed, tylenol cold and flu with some relief-states she had neg covid home test-NAD-steady gait

## 2023-09-18 ENCOUNTER — Other Ambulatory Visit: Payer: Self-pay | Admitting: Adult Health

## 2023-09-18 DIAGNOSIS — F411 Generalized anxiety disorder: Secondary | ICD-10-CM

## 2023-09-18 DIAGNOSIS — F5105 Insomnia due to other mental disorder: Secondary | ICD-10-CM

## 2023-09-18 DIAGNOSIS — F321 Major depressive disorder, single episode, moderate: Secondary | ICD-10-CM

## 2023-09-21 IMAGING — CT CT CARDIAC CORONARY ARTERY CALCIUM SCORE
3 series · 14 of 20 positions shown, 16 images · non-contrast
Comparison: None
COMPARISON: None

Addendum:
CLINICAL DATA: OVER-READ INTERPRETATION  CT CHEST
CLINICAL DATA: Cardiovascular Disease Risk stratification

EXAM:
Coronary Calcium Score
TECHNIQUE: A gated, non-contrast computed tomography scan of the heart was
performed using 3mm slice thickness. Axial images were analyzed on a
dedicated workstation. Calcium scoring of the coronary arteries was
performed using the Agatston method.

[Series 2: cascseq 2.0 sa36 70% (id) · axial · 0.39mm/px · z∈[-209,-129]mm · 4 of 68 slices shown]
[im 14/68  vessel]
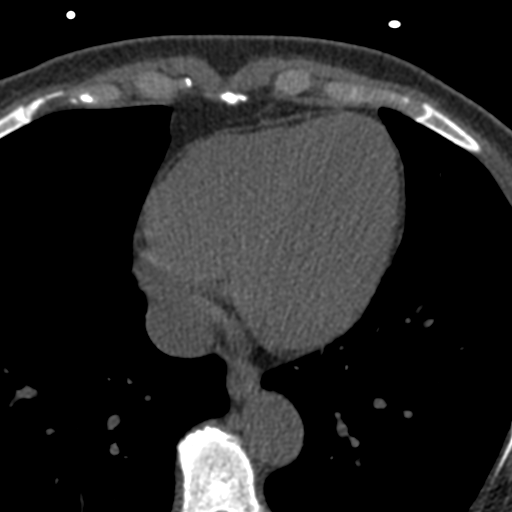
[im 27/68  vessel]
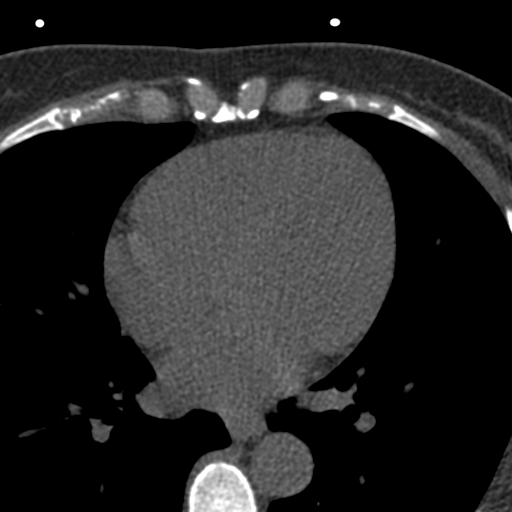
[im 41/68  vessel]
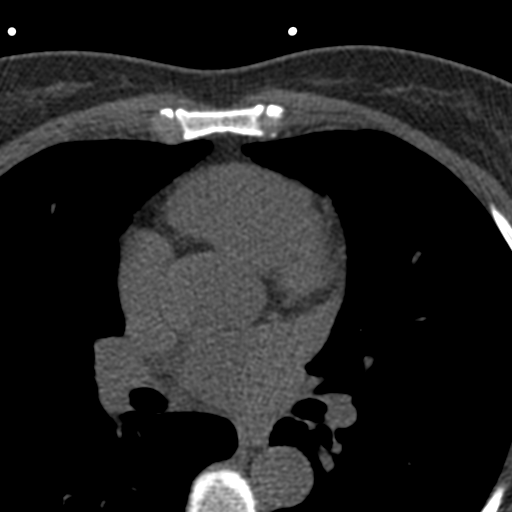
[im 54/68  vessel]
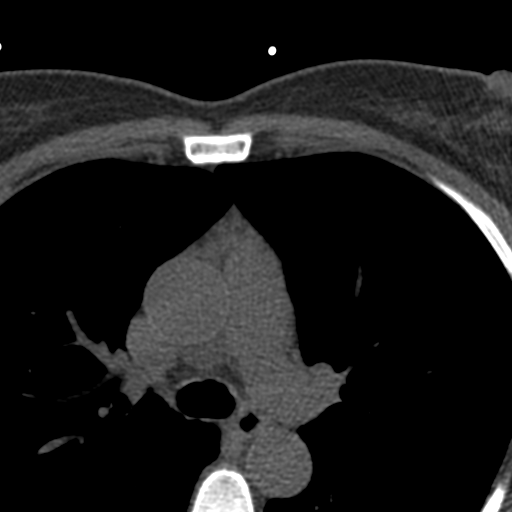

[Series 3: cascseq 2.0 bf37 st · axial · 0.66mm/px · z∈[-213,-125]mm · 5 of 68 slices shown, 7 images]
[im 12/68  vessel]
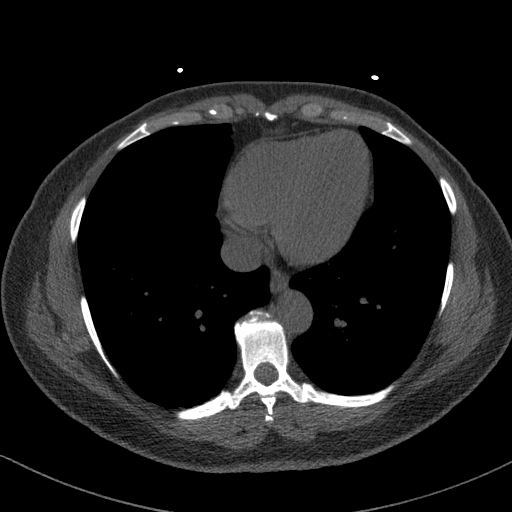
[im 12/68  lung]
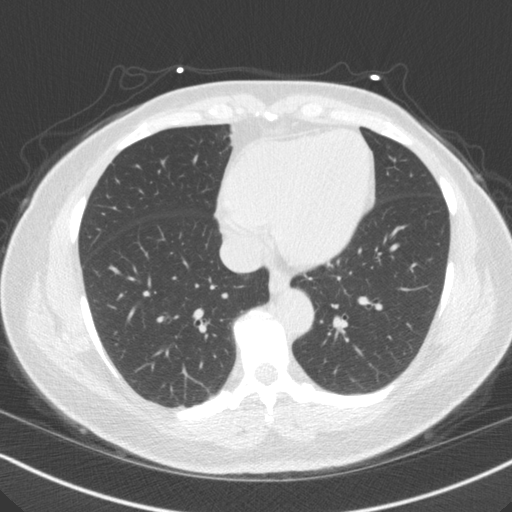
[im 23/68  vessel]
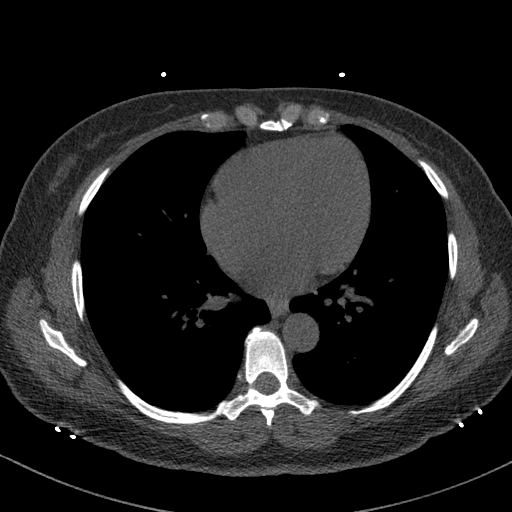
[im 34/68  vessel]
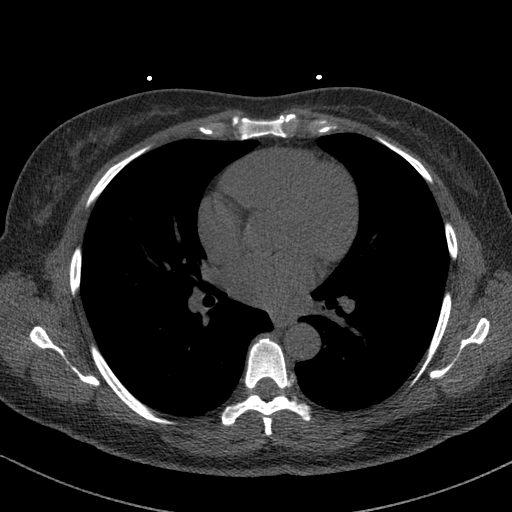
[im 45/68  vessel]
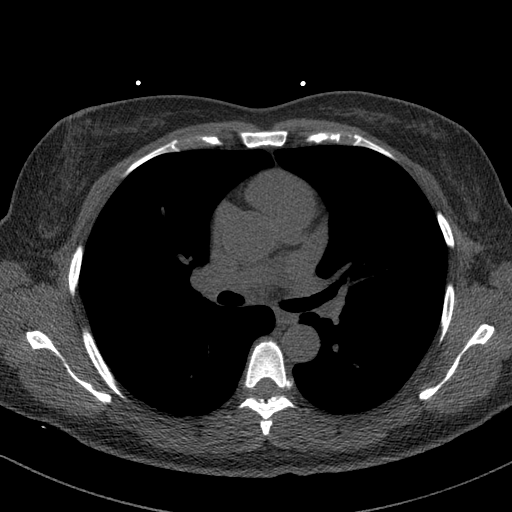
[im 56/68  vessel]
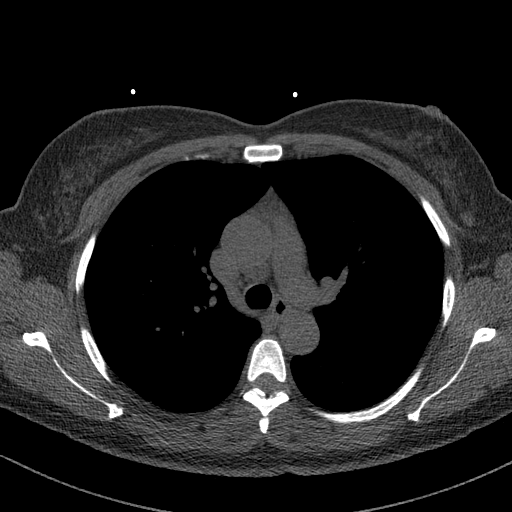
[im 56/68  lung]
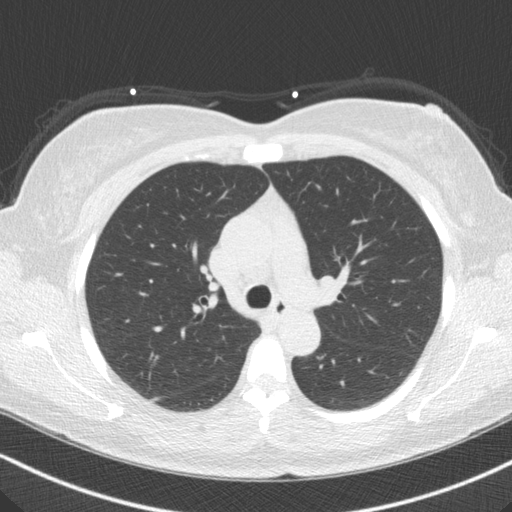

[Series 4: cascseq 2.0 br59 lung · axial · 0.66mm/px · z∈[-213,-125]mm · 5 of 68 slices shown]
[im 12/68  lung]
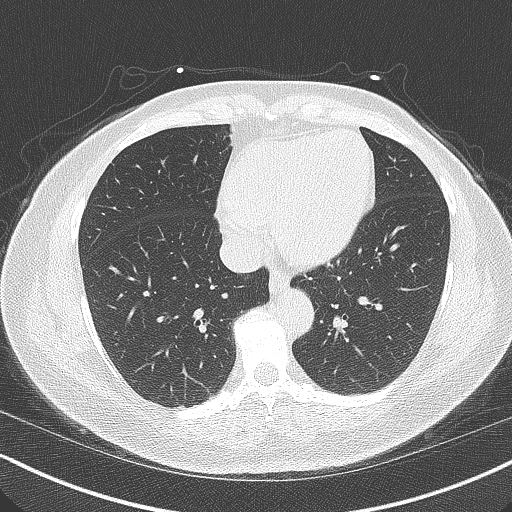
[im 23/68  lung]
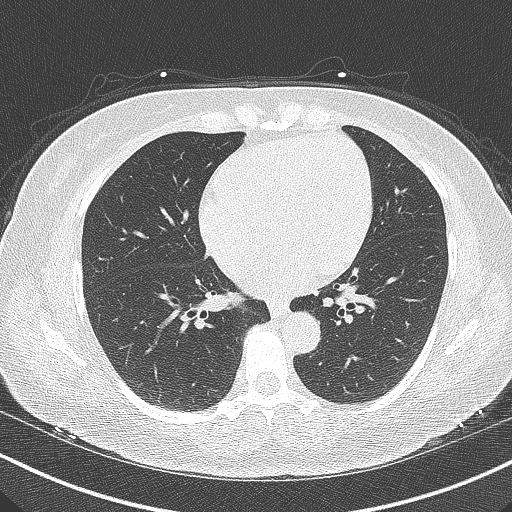
[im 34/68  lung]
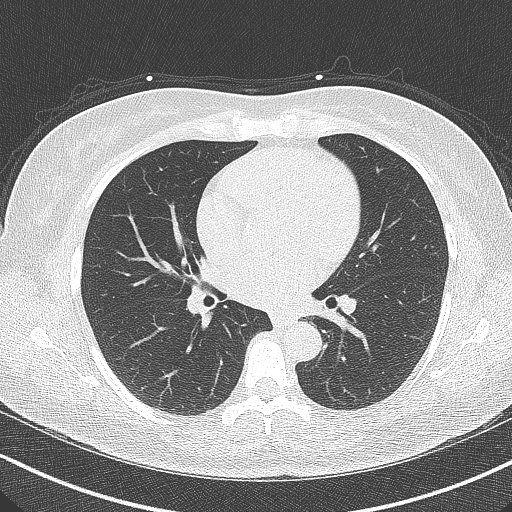
[im 45/68  lung]
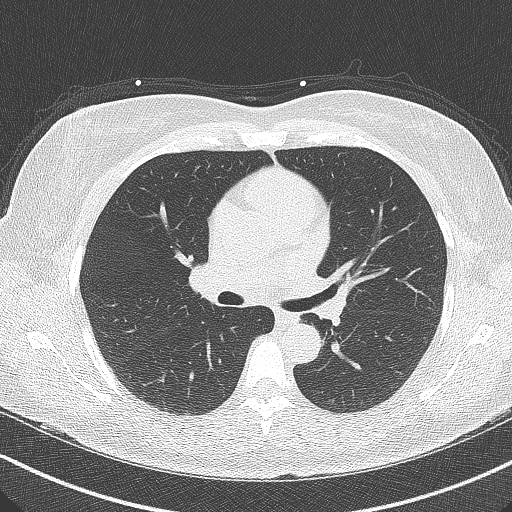
[im 56/68  lung]
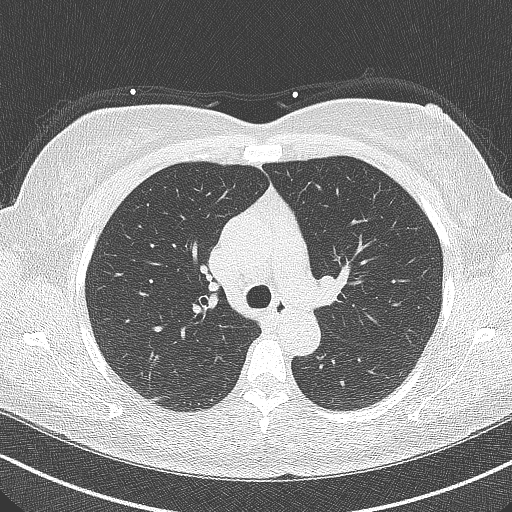

[14 of 20 positions shown; findings below may reference images not displayed]

The following report is an over-read performed by radiologist Dr.
over-read does not include interpretation of cardiac or coronary
anatomy or pathology. The coronary calcium scoring interpretation by
the cardiologist is attached. Imaging of the chest is focused on
cardiac structures and excludes much of the chest on CT.
FINDINGS: Cardiovascular: Please see dedicated report for cardiovascular
details.

Mediastinum/Nodes: Imaged portions of the mediastinum without acute
process or signs of adenopathy.

Lungs/Pleura: Lungs are clear in the visualized chest, imaging
confined to the mid chest and cardiac structures only. No signs of
effusion or consolidation. Airways are patent.

Upper Abdomen: Incidental imaging of upper abdominal contents very
limited assessment without acute findings.

Musculoskeletal: No acute bone finding. No destructive bone process.
Spinal degenerative changes.
IMPRESSION: No acute or significant extracardiac findings.
FINDINGS: Coronary arteries: Normal origins.

Coronary Calcium Score:

Left main: 0

Left anterior descending artery: 0

Left circumflex artery: 0

Right coronary artery: 0

Total: 0

Percentile: 0

Pericardium: Normal.

Aorta: Normal caliber.

Non-cardiac: See separate report from [REDACTED].
IMPRESSION: Coronary calcium score of 0. This is a low risk study.



If CAC=0, it is reasonable to withhold statin therapy and reassess
in 5 to 10 years, as long as higher risk conditions are absent
(diabetes mellitus, family history of premature CHD in first degree
relatives (males <55 years; females <65 years), cigarette smoking,
or LDL >=190 mg/dL).

If CAC is 1 to 99, it is reasonable to initiate statin therapy for
patients >=55 years of age.

If CAC is >=100 or >=75th percentile, it is reasonable to initiate
statin therapy at any age.

Cardiology referral should be considered for patients with CAC
scores >=400 or >=75th percentile.

*9980 AHA/ACC/AACVPR/AAPA/ABC/RUSBERT/ABEYTA/HAKOBJANIAN/Kaoje/LIFTGO/IDODE/JUMPER
Guideline on the Management of Blood Cholesterol: A Report of the
American College of Cardiology/American Heart Association Task Force
on Clinical Practice Guidelines. J Am Coll Cardiol.
4723;73(24):9606-9173.

*** End of Addendum ***
The following report is an over-read performed by radiologist Dr.
over-read does not include interpretation of cardiac or coronary
anatomy or pathology. The coronary calcium scoring interpretation by
the cardiologist is attached. Imaging of the chest is focused on
cardiac structures and excludes much of the chest on CT.
FINDINGS: Cardiovascular: Please see dedicated report for cardiovascular
details.

Mediastinum/Nodes: Imaged portions of the mediastinum without acute
process or signs of adenopathy.

Lungs/Pleura: Lungs are clear in the visualized chest, imaging
confined to the mid chest and cardiac structures only. No signs of
effusion or consolidation. Airways are patent.

Upper Abdomen: Incidental imaging of upper abdominal contents very
limited assessment without acute findings.

Musculoskeletal: No acute bone finding. No destructive bone process.
Spinal degenerative changes.
IMPRESSION: No acute or significant extracardiac findings.

## 2024-01-11 ENCOUNTER — Other Ambulatory Visit: Payer: Self-pay | Admitting: Adult Health

## 2024-01-11 DIAGNOSIS — F5105 Insomnia due to other mental disorder: Secondary | ICD-10-CM

## 2024-01-11 DIAGNOSIS — F411 Generalized anxiety disorder: Secondary | ICD-10-CM

## 2024-01-11 DIAGNOSIS — F321 Major depressive disorder, single episode, moderate: Secondary | ICD-10-CM

## 2024-03-02 ENCOUNTER — Telehealth: Admitting: Adult Health

## 2024-03-02 ENCOUNTER — Encounter: Payer: Self-pay | Admitting: Adult Health

## 2024-03-02 DIAGNOSIS — F321 Major depressive disorder, single episode, moderate: Secondary | ICD-10-CM

## 2024-03-02 DIAGNOSIS — F411 Generalized anxiety disorder: Secondary | ICD-10-CM

## 2024-03-02 DIAGNOSIS — F5105 Insomnia due to other mental disorder: Secondary | ICD-10-CM

## 2024-03-02 DIAGNOSIS — G4700 Insomnia, unspecified: Secondary | ICD-10-CM | POA: Diagnosis not present

## 2024-03-02 DIAGNOSIS — F329 Major depressive disorder, single episode, unspecified: Secondary | ICD-10-CM

## 2024-03-02 MED ORDER — SERTRALINE HCL 50 MG PO TABS: ORAL_TABLET | ORAL | 1 refills | Status: DC

## 2024-03-02 NOTE — Progress Notes (Addendum)
 Kim Gillespie [id number] [birth year] 53 y.o.  Virtual Visit via Video Note  I connected with pt @ on 03/02/24 at 10:00 AM EDT by a video enabled telemedicine application and verified that I am speaking with the correct person using two identifiers.   I discussed the limitations of evaluation and management by telemedicine and the availability of in person appointments. The patient expressed understanding and agreed to proceed.  I discussed the assessment and treatment plan with the patient. The patient was provided an opportunity to ask questions and all were answered. The patient agreed with the plan and demonstrated an understanding of the instructions.   The patient was advised to call back or seek an in-person evaluation if the symptoms worsen or if the condition fails to improve as anticipated.  I provided 20 minutes of non-face-to-face time during this encounter.  The patient was located at home.  The provider was located at [facility].   Angeline LOISE Sayers, NP   Subjective:   Patient ID:  Kim Gillespie is a 53 y.o. (DOB 11-15-1970) female.  Chief Complaint: No chief complaint on file.   HPI Breeona Waid presents for follow-up of MDD, GAD and insomnia.  Describes mood today as ok. Pleasant. Denies tearfulness. Mood symptoms - denies depression and irritability. Reports anxiety at times. Denies panic attacks. Denies worry, rumination, and over thinking. Denies obsessive thoughts or acts. Reports stable interest and motivation. Reports mood is stable. Stating I feel like I'm doing ok. Reports she is tapering off the Zoloft  - now at 50mg  Taking medications as prescribed. Energy levels improved. Active, does not have a regular exercise routine. Enjoys some usual interests and activities. Married. Lives with husband - 3 children. Spending time with family. No extended family local.  Appetite adequate. Weight gain - 198 - 218 pounds. Sleeps better some nights than others.   Focus and concentration stable. Completing tasks. Managing some aspects of household. 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 Denies SI or HI. Denies AH or VH. Denies recent self harm. Denies substance use.  Previous medication trials: Denies  Review of Systems:  Review of Systems  Musculoskeletal:  Negative for gait problem.  Neurological:  Negative for tremors.  Psychiatric/Behavioral:         Please refer to HPI    Medications: I have reviewed the patient's current medications.  Current Outpatient Medications  Medication Sig Dispense Refill   Acyclovir (ZOVIRAX PO) Take by mouth as needed.     amLODipine  (NORVASC ) 5 MG tablet Take 1 tablet (5 mg total) by mouth daily. 90 tablet 0   amoxicillin  (AMOXIL ) 875 MG tablet Take 1 tablet (875 mg total) by mouth 2 (two) times daily. 14 tablet 0   ascorbic acid (VITAMIN C) 100 MG tablet Take 100 mg by mouth daily.     cetirizine  (ZYRTEC  ALLERGY) 10 MG tablet Take 1 tablet (10 mg total) by mouth daily. 30 tablet 0   cyanocobalamin  50 MCG tablet Take 50 mcg by mouth daily.     fluticasone  (FLONASE ) 50 MCG/ACT nasal spray Place 2 sprays into both nostrils daily. (Patient not taking: Reported on 06/17/2023) 16 g 0   folic acid  (FOLVITE ) 1 MG tablet Take 1 tablet (1 mg total) by mouth daily. 30 tablet 3   hydrochlorothiazide (HYDRODIURIL) 12.5 MG tablet Take 12.5 mg by mouth daily.     pseudoephedrine  (SUDAFED) 60 MG tablet Take 1 tablet (60 mg total) by mouth every 8 (  eight) hours as needed for congestion. 30 tablet 0   sertraline  (ZOLOFT ) 50 MG tablet Take 1 tablet (50 mg total) by mouth 2 (two) times daily. 180 tablet 1   No current facility-administered medications for this visit.    Medication Side Effects: None  Allergies:  Allergies  Allergen Reactions   Iodine Shortness Of Breath   Iodinated Contrast Media Itching, Nausea Only and Cough    Past Medical History:  Diagnosis Date   Goals of care,  counseling/discussion 09/09/2021   Iron  deficiency anemia 09/09/2021   Menometrorrhagia 09/09/2021    Family History  Problem Relation Age of Onset   Hypertension Mother    Diabetes Father    Hypertension Father    Hypertension Brother     Social History   Socioeconomic History   Marital status: Married    Spouse name: Not on file   Number of children: Not on file   Years of education: Not on file   Highest education level: Not on file  Occupational History   Not on file  Tobacco Use   Smoking status: Never   Smokeless tobacco: Never  Vaping Use   Vaping status: Never Used  Substance and Sexual Activity   Alcohol use: Never   Drug use: Never   Sexual activity: Yes    Birth control/protection: Surgical  Other Topics Concern   Not on file  Social History Narrative   Lives at home with husband and children.  Three children.       Social Drivers of Corporate investment banker Strain: Low Risk  (01/15/2024)   Received from [facility]   Overall Financial Resource Strain (CARDIA)    Difficulty of Paying Living Expenses: Not very hard  Food Insecurity: No Food Insecurity (01/15/2024)   Received from [facility]   Hunger Vital Sign    Within the past 12 months, you worried that your food would run out before you got the money to buy more.: Never true    Within the past 12 months, the food you bought just didn't last and you didn't have money to get more.: Never true  Transportation Needs: No Transportation Needs (01/15/2024)   Received from [facility] - Transportation    Lack of Transportation (Medical): No    Lack of Transportation (Non-Medical): No  Physical Activity: Insufficiently Active (01/15/2024)   Received from [facility]   Exercise Vital Sign    On average, how many days per week do you engage in moderate to strenuous exercise (like a brisk walk)?: 1 day    On average, how many minutes do you engage in exercise at this level?: 20 min  Stress:  Stress Concern Present (01/15/2024)   Received from [facility] - Occupational Stress Questionnaire    Feeling of Stress : To some extent  Social Connections: Moderately Integrated (01/15/2024)   Received from [facility]   Social Network    How would you rate your social network (family, work, friends)?: Adequate participation with social networks  Intimate Partner Violence: Not At Risk (01/15/2024)   Received from [facility]   HITS    Over the last 12 months how often did your partner physically hurt you?: Never    Over the last 12 months how often did your partner insult you or talk down to you?: Never    Over the last 12 months how often did your partner threaten you with physical  harm?: Never    Over the last 12 months how often did your partner scream or curse at you?: Never    Past Medical History, Surgical history, Social history, and Family history were reviewed and updated as appropriate.   Please see review of systems for further details on the patient's review from today.   Objective:   Physical Exam:  There were no vitals taken for this visit.  Physical Exam Constitutional:      General: She is not in acute distress. Musculoskeletal:        General: No deformity.  Neurological:     Mental Status: She is alert and oriented to person, place, and time.     Coordination: Coordination normal.  Psychiatric:        Attention and Perception: Attention and perception normal. She does not perceive auditory or visual hallucinations.        Mood and Affect: Mood normal. Mood is not anxious or depressed. Affect is not labile, blunt, angry or inappropriate.        Speech: Speech normal.        Behavior: Behavior normal.        Thought Content: Thought content normal. Thought content is not paranoid or delusional. Thought content does not include homicidal or suicidal ideation. Thought content does not include homicidal or suicidal  plan.        Cognition and Memory: Cognition and memory normal.        Judgment: Judgment normal.     Comments: Insight intact     Lab Review:     Component Value Date/Time   NA 141 06/17/2023 0816   K 3.9 06/17/2023 0816   CL 104 06/17/2023 0816   CO2 30 06/17/2023 0816   GLUCOSE 108 (H) 06/17/2023 0816   BUN 18 06/17/2023 0816   CREATININE 0.83 06/17/2023 0816   CREATININE 0.74 08/11/2021 0000   CALCIUM 9.7 06/17/2023 0816   PROT 7.1 06/17/2023 0816   ALBUMIN 4.4 06/17/2023 0816   AST 14 (L) 06/17/2023 0816   ALT 20 06/17/2023 0816   ALKPHOS 69 06/17/2023 0816   BILITOT 0.4 06/17/2023 0816   GFRNONAA >60 06/17/2023 0816       Component Value Date/Time   WBC 8.4 06/17/2023 0816   WBC 7.9 08/11/2021 0000   RBC 4.66 06/17/2023 0816   RBC 4.66 06/17/2023 0816   HGB 14.3 06/17/2023 0816   HCT 43.3 06/17/2023 0816   PLT 278 06/17/2023 0816   MCV 92.9 06/17/2023 0816   MCH 30.7 06/17/2023 0816   MCHC 33.0 06/17/2023 0816   RDW 13.4 06/17/2023 0816   LYMPHSABS 1.3 06/17/2023 0816   MONOABS 0.6 06/17/2023 0816   EOSABS 0.1 06/17/2023 0816   BASOSABS 0.0 06/17/2023 0816    No results found for: POCLITH, LITHIUM   No results found for: PHENYTOIN, PHENOBARB, VALPROATE, CBMZ   .res Assessment: Plan:    Plan:  Zoloft  50mg  daily - decreased from 75mg  2 weeks ago - planning to continue taper until off of medication.  RTC as needed.  20 minutes spent dedicated to the care of this patient on the date of this encounter to include pre-visit review of records, ordering of medication, post visit documentation, and face-to-face time with the patient discussing depression, anxiety, insomnia and obsessional thoughts. Discussed continuing current medication regimen.  Patient advised to contact office with any questions, adverse effects, or acute worsening in signs and symptoms.  There are no diagnoses linked to this encounter.  Please see After Visit Summary for  patient specific instructions.  Future Appointments  Date Time Provider Department Center  03/02/2024 10:00 AM Zonya Gudger Nattalie, NP [facility] None    No orders of the defined types were placed in this encounter.     -------------------------------

## 2024-04-26 ENCOUNTER — Other Ambulatory Visit: Payer: Self-pay | Admitting: Adult Health

## 2024-04-26 DIAGNOSIS — F321 Major depressive disorder, single episode, moderate: Secondary | ICD-10-CM

## 2024-04-26 DIAGNOSIS — F411 Generalized anxiety disorder: Secondary | ICD-10-CM

## 2024-04-26 DIAGNOSIS — F99 Mental disorder, not otherwise specified: Secondary | ICD-10-CM
